# Patient Record
Sex: Male | Born: 1970 | Race: White | Hispanic: No | Marital: Married | Smoking: Former smoker
Health system: Southern US, Community
[De-identification: ages and names within clinical notes are randomized; demographics above are authoritative.]

## PROBLEM LIST (undated history)

## (undated) DIAGNOSIS — K259 Gastric ulcer, unspecified as acute or chronic, without hemorrhage or perforation: Secondary | ICD-10-CM

## (undated) DIAGNOSIS — I10 Essential (primary) hypertension: Secondary | ICD-10-CM

---

## 2004-03-01 ENCOUNTER — Other Ambulatory Visit: Payer: Self-pay

## 2007-07-19 ENCOUNTER — Encounter: Payer: Self-pay | Admitting: Internal Medicine

## 2010-12-27 ENCOUNTER — Emergency Department: Payer: Self-pay | Admitting: Internal Medicine

## 2012-03-30 ENCOUNTER — Ambulatory Visit (INDEPENDENT_AMBULATORY_CARE_PROVIDER_SITE_OTHER): Payer: 59 | Admitting: Emergency Medicine

## 2012-03-30 VITALS — BP 126/74 | HR 53 | Temp 98.0°F | Resp 17 | Ht 65.5 in | Wt 150.0 lb

## 2012-03-30 DIAGNOSIS — N644 Mastodynia: Secondary | ICD-10-CM

## 2012-03-30 DIAGNOSIS — R509 Fever, unspecified: Secondary | ICD-10-CM

## 2012-03-30 MED ORDER — DOXYCYCLINE HYCLATE 100 MG PO CAPS: 100.0000 mg | ORAL_CAPSULE | Freq: Two times a day (BID) | ORAL | Status: AC

## 2012-03-30 MED ORDER — RIZATRIPTAN BENZOATE 10 MG PO TBDP: 10.0000 mg | ORAL_TABLET | ORAL | Status: DC | PRN

## 2012-03-30 NOTE — Progress Notes (Signed)
9  Date:  03/30/2012   Name:  RYKKER COVIELLO   DOB:  01-26-1971   MRN:  [id number]  PCP:  No primary provider on file.    Chief Complaint: Edema   History of Present Illness:  Justin Davies is a 41 y.o. very pleasant male patient who presents with the following:  Headache, myalgias, arthralgias, rash and pain in left breast and both axillae.  [place]  There is no problem list on file for this patient.   No past medical history on file.  No past surgical history on file.  History  Substance Use Topics  . Smoking status: Never Smoker   . Smokeless tobacco: Not on file  . Alcohol Use: Not on file    No family history on file.  No Known Allergies  Medication list has been reviewed and updated.  No current outpatient prescriptions on file prior to visit.    Review of Systems:  As per HPI, otherwise negative.    Physical Examination: Filed Vitals:   03/30/12 1221  BP: 126/74  Pulse: 53  Temp: 98 F (36.7 C)  Resp: 17   Filed Vitals:   03/30/12 1221  Height: 5' 5.5" (1.664 m)  Weight: 150 lb (68.04 kg)   Body mass index is 24.58 kg/(m^2). Ideal Body Weight: Weight in (lb) to have BMI = 25: 152.2  GEN: WDWN, NAD, Non-toxic, A & O x 3 HEENT: Atraumatic, Normocephalic. Neck supple. No masses, No LAD. Ears and Nose: No external deformity. CV: RRR, No M/G/R. No JVD. No thrill. No extra heart sounds. PULM: CTA B, no wheezes, crackles, rhonchi. No retractions. No resp. distress. No accessory muscle use. ABD: S, NT, ND, +BS. No rebound. No HSM. EXTR: No c/c/e NEURO Normal gait.  PSYCH: Normally interactive. Conversant. Not depressed or anxious appearing.  Calm demeanor.  Breast:  Left breast tenderness no mass Axillae:  No palpable adenopathy but generalized tenderness Skin:  Rash no pruritic disseminated, 1-2 mm   Assessment and Plan: Breast pain:  Sonogram Rash:  Tick panel, doxycycline Migraine:  maxalt Follow up  1 week  Carmelina Dane, MD

## 2012-03-31 ENCOUNTER — Other Ambulatory Visit: Payer: Self-pay

## 2012-03-31 DIAGNOSIS — N644 Mastodynia: Secondary | ICD-10-CM

## 2012-04-01 ENCOUNTER — Encounter: Payer: Self-pay | Admitting: Emergency Medicine

## 2012-04-05 ENCOUNTER — Telehealth: Payer: Self-pay

## 2012-04-05 NOTE — Telephone Encounter (Signed)
I have left message to advise labs normal, letter was sent

## 2012-04-05 NOTE — Telephone Encounter (Signed)
Pt would like to know if his labs are in yet. Best# [phone]

## 2013-01-22 ENCOUNTER — Emergency Department: Payer: Self-pay | Admitting: Emergency Medicine

## 2014-11-12 ENCOUNTER — Emergency Department: Payer: Self-pay | Admitting: Emergency Medicine

## 2015-08-01 ENCOUNTER — Emergency Department
Admission: EM | Admit: 2015-08-01 | Discharge: 2015-08-01 | Disposition: A | Discharge status: Home / Self Care | Payer: Commercial Managed Care - HMO | Attending: Emergency Medicine | Admitting: Emergency Medicine

## 2015-08-01 ENCOUNTER — Emergency Department: Payer: Commercial Managed Care - HMO

## 2015-08-01 ENCOUNTER — Other Ambulatory Visit: Payer: Self-pay

## 2015-08-01 ENCOUNTER — Encounter: Payer: Self-pay | Admitting: *Deleted

## 2015-08-01 DIAGNOSIS — J398 Other specified diseases of upper respiratory tract: Secondary | ICD-10-CM | POA: Insufficient documentation

## 2015-08-01 DIAGNOSIS — R1010 Upper abdominal pain, unspecified: Secondary | ICD-10-CM | POA: Diagnosis present

## 2015-08-01 DIAGNOSIS — I10 Essential (primary) hypertension: Secondary | ICD-10-CM | POA: Diagnosis not present

## 2015-08-01 DIAGNOSIS — K2970 Gastritis, unspecified, without bleeding: Secondary | ICD-10-CM | POA: Insufficient documentation

## 2015-08-01 HISTORY — DX: Gastric ulcer, unspecified as acute or chronic, without hemorrhage or perforation: K25.9

## 2015-08-01 HISTORY — DX: Essential (primary) hypertension: I10

## 2015-08-01 LAB — COMPREHENSIVE METABOLIC PANEL
ALBUMIN: 4.1 g/dL (ref 3.5–5.0)
ALT: 19 U/L (ref 17–63)
ANION GAP: 9 (ref 5–15)
AST: 17 U/L (ref 15–41)
Alkaline Phosphatase: 66 U/L (ref 38–126)
BILIRUBIN TOTAL: 0.4 mg/dL (ref 0.3–1.2)
BUN: 14 mg/dL (ref 6–20)
CHLORIDE: 104 mmol/L (ref 101–111)
CO2: 25 mmol/L (ref 22–32)
Calcium: 9.4 mg/dL (ref 8.9–10.3)
Creatinine, Ser: 0.87 mg/dL (ref 0.61–1.24)
GFR calc Af Amer: >60 mL/min (ref >60)
GFR calc non Af Amer: >60 mL/min (ref >60)
GLUCOSE: 103 mg/dL — AB (ref 65–99)
POTASSIUM: 3.6 mmol/L (ref 3.5–5.1)
SODIUM: 138 mmol/L (ref 135–145)
TOTAL PROTEIN: 7.2 g/dL (ref 6.5–8.1)

## 2015-08-01 LAB — TROPONIN I

## 2015-08-01 LAB — CBC
HEMATOCRIT: 46.8 % (ref 40.0–52.0)
HEMOGLOBIN: 15.7 g/dL (ref 13.0–18.0)
MCH: 30.4 pg (ref 26.0–34.0)
MCHC: 33.5 g/dL (ref 32.0–36.0)
MCV: 91 fL (ref 80.0–100.0)
Platelets: 309 10*3/uL (ref 150–440)
RBC: 5.15 MIL/uL (ref 4.40–5.90)
RDW: 13 % (ref 11.5–14.5)
WBC: 12.5 10*3/uL — ABNORMAL HIGH (ref 3.8–10.6)

## 2015-08-01 LAB — LIPASE, BLOOD: Lipase: 17 U/L (ref 11–51)

## 2015-08-01 MED ORDER — SODIUM CHLORIDE 0.9 % IV BOLUS (SEPSIS)
1000.0000 mL | Freq: Once | INTRAVENOUS | Status: AC
  Administered 2015-08-01: 1000 mL via INTRAVENOUS

## 2015-08-01 MED ORDER — MORPHINE SULFATE (PF) 4 MG/ML IV SOLN
4.0000 mg | Freq: Once | INTRAVENOUS | Status: AC
  Administered 2015-08-01: 4 mg via INTRAVENOUS
  Filled 2015-08-01: qty 1

## 2015-08-01 MED ORDER — IOHEXOL 350 MG/ML SOLN
100.0000 mL | Freq: Once | INTRAVENOUS | Status: AC | PRN
  Administered 2015-08-01: 100 mL via INTRAVENOUS
  Filled 2015-08-01: qty 100

## 2015-08-01 MED ORDER — IOHEXOL 240 MG/ML SOLN
25.0000 mL | Freq: Once | INTRAMUSCULAR | Status: AC | PRN
  Administered 2015-08-01: 25 mL via ORAL
  Filled 2015-08-01: qty 25

## 2015-08-01 MED ORDER — ONDANSETRON 4 MG PO TBDP: 4.0000 mg | ORAL_TABLET | Freq: Four times a day (QID) | ORAL | Status: DC | PRN

## 2015-08-01 MED ORDER — ONDANSETRON HCL 4 MG/2ML IJ SOLN
4.0000 mg | Freq: Once | INTRAMUSCULAR | Status: AC
  Administered 2015-08-01: 4 mg via INTRAVENOUS

## 2015-08-01 MED ORDER — OXYCODONE-ACETAMINOPHEN 5-325 MG PO TABS
1.0000 | ORAL_TABLET | ORAL | Status: AC
  Administered 2015-08-01: 1 via ORAL

## 2015-08-01 MED ORDER — OXYCODONE-ACETAMINOPHEN 5-325 MG PO TABS: 1.0000 | ORAL_TABLET | Freq: Four times a day (QID) | ORAL | Status: DC | PRN

## 2015-08-01 MED ORDER — ONDANSETRON HCL 4 MG/2ML IJ SOLN
INTRAMUSCULAR | Status: AC
  Administered 2015-08-01: 4 mg via INTRAVENOUS
  Filled 2015-08-01: qty 2

## 2015-08-01 MED ORDER — ESOMEPRAZOLE MAGNESIUM 20 MG PO CPDR: 20.0000 mg | DELAYED_RELEASE_CAPSULE | Freq: Every day | ORAL | Status: DC

## 2015-08-01 MED ORDER — AZITHROMYCIN 250 MG PO TABS: ORAL_TABLET | ORAL | Status: DC

## 2015-08-01 MED ORDER — OXYCODONE-ACETAMINOPHEN 5-325 MG PO TABS
ORAL_TABLET | ORAL | Status: AC
  Administered 2015-08-01: 1 via ORAL
  Filled 2015-08-01: qty 1

## 2015-08-01 NOTE — ED Provider Notes (Signed)
[facility] Emergency Department Provider Note REMINDER - THIS NOTE IS NOT A FINAL MEDICAL RECORD UNTIL IT IS SIGNED. UNTIL THEN, THE CONTENT BELOW MAY REFLECT INFORMATION FROM A DOCUMENTATION TEMPLATE, NOT THE ACTUAL PATIENT VISIT. ____________________________________________  Time seen: Approximately 6:29 PM  I have reviewed the triage vital signs and the nursing notes.   HISTORY  Chief Complaint Abdominal Pain    HPI Justin Davies is a 44 y.o. male no significant previous medical history other than previous stomach ulcers.  Patient reports that he's been having some cough and congestion and a sinus infection over the last few days, started amoxicillin and stomach pain has been worsening since then. He reports he feels as though he has a "stomach virus" 3 days ago, and is now having increasing pain in the upper matter abdomen that radiates around to his back. Denies having a fever. No chills. No chest pain or any trouble breathing.  Able to eat and drink, but states that today having moderate nausea and fairly severe pain in the upper abdomen slowly worsening for the day.  He has not had any bloody vomit, he has had a couple loose stools the been nonblack and nonbloody.   Past Medical History  Diagnosis Date  . Multiple gastric ulcers   . Hypertension     There are no active problems to display for this patient.   History reviewed. No pertinent past surgical history.  Current Outpatient Rx  Name  Route  Sig  Dispense  Refill  . azithromycin (ZITHROMAX Z-PAK) 250 MG tablet      As directed on package   6 each   0   . esomeprazole (NEXIUM) 20 MG capsule   Oral   Take 1 capsule (20 mg total) by mouth daily.   30 capsule   1   . ondansetron (ZOFRAN ODT) 4 MG disintegrating tablet   Oral   Take 1 tablet (4 mg total) by mouth every 6 (six) hours as needed for nausea or vomiting.   20 tablet   0   . oxyCODONE-acetaminophen (ROXICET) 5-325  MG tablet   Oral   Take 1 tablet by mouth every 6 (six) hours as needed for severe pain.   20 tablet   0     Allergies Review of patient's allergies indicates no known allergies.  No family history on file.  Social History Social History  Substance Use Topics  . Smoking status: Never Smoker   . Smokeless tobacco: None  . Alcohol Use: No    Review of Systems Constitutional: No fever/chills Eyes: No visual changes. ENT:  Slightly scratchy throat, also sinus pressure getting somewhat better after starting amoxicillin Cardiovascular: Denies chest pain. Respiratory: Denies shortness of breath. Gastrointestinal:  Couple loose stools today. No vomiting, nausea and pain are notable. Genitourinary: Negative for dysuria. Musculoskeletal: Negative for back pain. Skin: Negative for rash. Neurological: Negative for headaches, focal weakness or numbness.  10-point ROS otherwise negative.  ____________________________________________   PHYSICAL EXAM:  VITAL SIGNS: ED Triage Vitals  Enc Vitals Group     BP 08/01/15 1637 152/117 mmHg     Pulse Rate 08/01/15 1637 82     Resp 08/01/15 1637 24     Temp 08/01/15 1637 98.3 F (36.8 C)     Temp Source 08/01/15 1637 Oral     SpO2 08/01/15 1637 98 %     Weight 08/01/15 1637 147 lb (66.679 kg)     Height 08/01/15 1637 5'  6" (1.676 m)     Head Cir --      Peak Flow --      Pain Score 08/01/15 1638 7     Pain Loc --      Pain Edu? --      Excl. in GC? --    Constitutional: Alert and oriented. Well appearing and in no acute distress. Eyes: Conjunctivae are normal. PERRL. EOMI. Head: Atraumatic. Nose: No congestion/rhinnorhea. Mouth/Throat: Mucous membranes are moist.  Oropharynx non-erythematous. and mild tonsillar hypertrophy bilaterally, no exudates. Neck: No stridor.   Cardiovascular: Normal rate, regular rhythm. Grossly normal heart sounds.  Good peripheral circulation. Respiratory: Normal respiratory effort.  No retractions.  Lungs CTAB. Gastrointestinal: Soft and nontender no lower abdomen and right lower quadrant, however he has moderate tenderness in the epigastrium without rebound or guarding . No distention. No abdominal bruits. No CVA tenderness. Musculoskeletal: No lower extremity tenderness nor edema.  No joint effusions. Neurologic:  Normal speech and language. No gross focal neurologic deficits are appreciated. No gait instability. Skin:  Skin is warm, dry and intact. No rash noted. Psychiatric: Mood and affect are normal. Speech and behavior are normal.  ____________________________________________   LABS (all labs ordered are listed, but only abnormal results are displayed)  Labs Reviewed  COMPREHENSIVE METABOLIC PANEL - Abnormal; Notable for the following:    Glucose, Bld 103 (*)    All other components within normal limits  CBC - Abnormal; Notable for the following:    WBC 12.5 (*)    All other components within normal limits  LIPASE, BLOOD  TROPONIN I   ____________________________________________  EKG  ED ECG REPORT I, Kathia Covington, the attending physician, personally viewed and interpreted this ECG.  Date: 08/01/2015 EKG Time: 1700 Rate: 75 Rhythm: normal sinus rhythm QRS Axis: normal Intervals: normal ST/T Wave abnormalities: normal Conduction Disutrbances: none Narrative Interpretation: unremarkable, computer notes previous septal infarct. I would disagree with this reading, likely secondary to lead positioning and I see no signs of acute ischemia.  ____________________________________________  RADIOLOGY  CT Abdomen Pelvis W Contrast (Final result) Result time: 08/01/15 18:32:59   Final result by Rad Results In Interface (08/01/15 18:32:59)   Narrative:   CLINICAL DATA: 44 year old male with pain across the upper abdomen which radiates around the back. Nausea.  EXAM: CT ABDOMEN AND PELVIS WITH CONTRAST  TECHNIQUE: Multidetector CT imaging of the abdomen and pelvis  was performed using the standard protocol following bolus administration of intravenous contrast.  CONTRAST: OMNIPAQUE IOHEXOL 350 MG/ML SOLN  COMPARISON: No priors.  FINDINGS: Lower chest: Scarring in the right middle lobe.  Hepatobiliary: No significant cystic or solid hepatic lesions. No intra or extrahepatic biliary ductal dilatation. Gallbladder is normal in appearance.  Pancreas: No pancreatic mass. No pancreatic ductal dilatation. No pancreatic or peripancreatic fluid or inflammatory changes.  Spleen: No priors.  Adrenals/Urinary Tract: Bilateral kidneys and bilateral adrenal glands are normal in appearance. No hydroureteronephrosis. Urinary bladder is normal in appearance.  Stomach/Bowel: Normal appearance of the stomach. No pathologic dilatation of small bowel or colon. Normal appendix.  Vascular/Lymphatic: Atherosclerosis throughout the abdominal and pelvic vasculature, without evidence of aneurysm. No lymphadenopathy noted in the abdomen or pelvis.  Reproductive: Prostate gland and seminal vesicles are unremarkable in appearance.  Other: No significant volume of ascites. No pneumoperitoneum.  Musculoskeletal: There are no aggressive appearing lytic or blastic lesions noted in the visualized portions of the skeleton.  IMPRESSION: 1. No acute findings in the abdomen or pelvis to  account for the patient's symptoms. 2. Normal appendix. 3. Mild atherosclerosis.     ____________________________________________   PROCEDURES  Procedure(s) performed: None  Critical Care performed: No  ____________________________________________   INITIAL IMPRESSION / ASSESSMENT AND PLAN / ED COURSE  Pertinent labs & imaging results that were available during my care of the patient were reviewed by me and considered in my medical decision making (see chart for details).  This presents for increasing nausea with severe upper abdominal pain worsening  throughout the day. He is currently on amoxicillin, and has what appears to be likely mild sinusitis or upper respiratory infection. No signs or symptoms of instability, abdominal exam does demonstrate focal epigastric tenderness but no surgical signs or symptoms. He does not report any bloody emesis, no blood in his stool. His hemoglobin is normal. Again severe bleeding nor major ulcerative bleed. There is no evidence of perforation on CT. CT scan is reassuring, EKG no ischemic changes and negative troponin without significant cardiac risk factors.  Doesn't mild leukocytosis which is probably accounted for by his sinusitis/upper respiratory infection.  ----------------------------------------- 6:55 PM on 08/01/2015 -----------------------------------------  Patient reports pain nausea and symptoms are improved, still some upper abdominal pain but no rebound, guarding or notable peritoneal signs. He is awake alert in no distress and ambulatory. Nothing to suggest acute intra-abdominal process or acute cardiopulmonary process this time. Denies current on her symptoms. We'll place the patient on Nexium, advise close follow-up with his primary care doctor and GI. Careful abdominal pain return precautions advised, patient very agreeable. Much improved. He agrees not to drive tonight.  I will prescribe the patient a narcotic pain medicine due to their condition which I anticipate will cause at least moderate pain short term. I discussed with the patient safe use of narcotic pain medicines, and that they are not to drive, work in dangerous areas, or ever take more than prescribed (no more than 1 pill every 6 hours). We discussed that this is the type of medication that "Criss Alvinerince" may have overdosed on and the risks of this type of medicine. Patient is very agreeable to only use as prescribed and to never use more than prescribed.  He is agreeable to follow closely with gastroenterology. We discussed very careful  return precautions and he is very agreeable and verbalized understanding.  ____________________________________________   FINAL CLINICAL IMPRESSION(S) / ED DIAGNOSES  Final diagnoses:  Upper abdominal pain  Gastritis   possible stomach ulcer    Sharyn CreamerMark Hermela Hardt, MD 08/01/15 1906

## 2015-08-01 NOTE — Discharge Instructions (Signed)
You were seen in the emergency room for abdominal pain. It is important that you follow up closely with your primary care doctor in the next couple of days. ° °If you're unable to see her primary care doctor you may return to the emergency room or go to the [facility] walk-in clinic in 1 or 2 days for reexam. ° °Please return to the emergency room right away if you are to develop a fever, severe nausea, your pain becomes severe or worsens, you are unable to keep food down, begin vomiting any dark or bloody fluid, you develop any dark or bloody stools, feel dehydrated, or other new concerns or symptoms arise. ° ° °Abdominal Pain, Adult °Many things can cause abdominal pain. Usually, abdominal pain is not caused by a disease and will improve without treatment. It can often be observed and treated at home. Your health care provider will do a physical exam and possibly order blood tests and X-rays to help determine the seriousness of your pain. However, in many cases, more time must pass before a clear cause of the pain can be found. Before that point, your health care provider may not know if you need more testing or further treatment. °HOME CARE INSTRUCTIONS °Monitor your abdominal pain for any changes. The following actions may help to alleviate any discomfort you are experiencing: °· Only take over-the-counter or prescription medicines as directed by your health care provider. °· Do not take laxatives unless directed to do so by your health care provider. °· Try a clear liquid diet (broth, tea, or water) as directed by your health care provider. Slowly move to a bland diet as tolerated. °SEEK MEDICAL CARE IF: °· You have unexplained abdominal pain. °· You have abdominal pain associated with nausea or diarrhea. °· You have pain when you urinate or have a bowel movement. °· You experience abdominal pain that wakes you in the night. °· You have abdominal pain that is worsened or improved by eating food. °· You have  abdominal pain that is worsened with eating fatty foods. °· You have a fever. °SEEK IMMEDIATE MEDICAL CARE IF: °· Your pain does not go away within 2 hours. °· You keep throwing up (vomiting). °· Your pain is felt only in portions of the abdomen, such as the right side or the left lower portion of the abdomen. °· You pass bloody or black tarry stools. °MAKE SURE YOU: °· Understand these instructions. °· Will watch your condition. °· Will get help right away if you are not doing well or get worse. °  °This information is not intended to replace advice given to you by your health care provider. Make sure you discuss any questions you have with your health care provider. °  °Document Released: 05/27/2005 Document Revised: 05/08/2015 Document Reviewed: 04/26/2013 °Elsevier Interactive Patient Education ©2016 [employer] ° °

## 2015-08-01 NOTE — ED Notes (Signed)
Pt states upper abd pain, states he feels like he has "ripped something or been shot", pt states he had a stomach virus 3 days ago and was also taking amoxicillin for a sinus infection, pt awake and alert, ambulatory to room

## 2015-08-01 NOTE — ED Notes (Signed)
Pain across upper abd and radiates around to back, prior had stomach virus, yesterday was ok, today developed abd pain, has some nausea

## 2017-11-29 ENCOUNTER — Other Ambulatory Visit: Payer: Self-pay | Admitting: Internal Medicine

## 2017-11-29 DIAGNOSIS — N61 Mastitis without abscess: Secondary | ICD-10-CM

## 2017-11-30 ENCOUNTER — Other Ambulatory Visit: Payer: Self-pay | Admitting: Internal Medicine

## 2017-11-30 DIAGNOSIS — N61 Mastitis without abscess: Secondary | ICD-10-CM

## 2017-12-03 ENCOUNTER — Ambulatory Visit
Admission: RE | Admit: 2017-12-03 | Discharge: 2017-12-03 | Disposition: A | Discharge status: Home / Self Care | Payer: 59 | Source: Ambulatory Visit | Attending: Internal Medicine | Admitting: Internal Medicine

## 2017-12-03 DIAGNOSIS — N61 Mastitis without abscess: Secondary | ICD-10-CM

## 2018-03-17 ENCOUNTER — Other Ambulatory Visit: Payer: Self-pay | Admitting: Internal Medicine

## 2018-03-17 DIAGNOSIS — M12811 Other specific arthropathies, not elsewhere classified, right shoulder: Secondary | ICD-10-CM

## 2018-06-29 ENCOUNTER — Other Ambulatory Visit: Payer: Self-pay | Admitting: Internal Medicine

## 2018-06-29 DIAGNOSIS — M12811 Other specific arthropathies, not elsewhere classified, right shoulder: Secondary | ICD-10-CM

## 2019-02-24 ENCOUNTER — Other Ambulatory Visit: Payer: Self-pay | Admitting: Internal Medicine

## 2019-02-24 DIAGNOSIS — N61 Mastitis without abscess: Secondary | ICD-10-CM

## 2019-03-02 ENCOUNTER — Observation Stay
Admission: EM | Admit: 2019-03-02 | Discharge: 2019-03-03 | Disposition: A | Discharge status: Home / Self Care | Payer: 59 | Attending: Internal Medicine | Admitting: Internal Medicine

## 2019-03-02 ENCOUNTER — Emergency Department: Payer: 59

## 2019-03-02 ENCOUNTER — Encounter: Payer: Self-pay | Admitting: Emergency Medicine

## 2019-03-02 ENCOUNTER — Other Ambulatory Visit: Payer: Self-pay

## 2019-03-02 DIAGNOSIS — Z1159 Encounter for screening for other viral diseases: Secondary | ICD-10-CM | POA: Insufficient documentation

## 2019-03-02 DIAGNOSIS — I10 Essential (primary) hypertension: Secondary | ICD-10-CM | POA: Insufficient documentation

## 2019-03-02 DIAGNOSIS — I160 Hypertensive urgency: Secondary | ICD-10-CM | POA: Insufficient documentation

## 2019-03-02 DIAGNOSIS — R0602 Shortness of breath: Secondary | ICD-10-CM | POA: Diagnosis present

## 2019-03-02 DIAGNOSIS — Z87891 Personal history of nicotine dependence: Secondary | ICD-10-CM | POA: Diagnosis not present

## 2019-03-02 DIAGNOSIS — Z79899 Other long term (current) drug therapy: Secondary | ICD-10-CM | POA: Diagnosis not present

## 2019-03-02 DIAGNOSIS — E876 Hypokalemia: Secondary | ICD-10-CM | POA: Diagnosis present

## 2019-03-02 DIAGNOSIS — K279 Peptic ulcer, site unspecified, unspecified as acute or chronic, without hemorrhage or perforation: Secondary | ICD-10-CM | POA: Insufficient documentation

## 2019-03-02 DIAGNOSIS — G43909 Migraine, unspecified, not intractable, without status migrainosus: Secondary | ICD-10-CM | POA: Diagnosis not present

## 2019-03-02 DIAGNOSIS — I491 Atrial premature depolarization: Secondary | ICD-10-CM | POA: Insufficient documentation

## 2019-03-02 DIAGNOSIS — R079 Chest pain, unspecified: Secondary | ICD-10-CM

## 2019-03-02 DIAGNOSIS — I200 Unstable angina: Principal | ICD-10-CM | POA: Insufficient documentation

## 2019-03-02 LAB — CBC
HCT: 49.4 % (ref 39.0–52.0)
Hemoglobin: 16.6 g/dL (ref 13.0–17.0)
MCH: 29.5 pg (ref 26.0–34.0)
MCHC: 33.6 g/dL (ref 30.0–36.0)
MCV: 87.9 fL (ref 80.0–100.0)
Platelets: 366 10*3/uL (ref 150–400)
RBC: 5.62 MIL/uL (ref 4.22–5.81)
RDW: 12.6 % (ref 11.5–15.5)
WBC: 13.1 10*3/uL — ABNORMAL HIGH (ref 4.0–10.5)
nRBC: 0 % (ref 0.0–0.2)

## 2019-03-02 LAB — BASIC METABOLIC PANEL
Anion gap: 10 (ref 5–15)
BUN: 11 mg/dL (ref 6–20)
CO2: 27 mmol/L (ref 22–32)
Calcium: 8.9 mg/dL (ref 8.9–10.3)
Chloride: 98 mmol/L (ref 98–111)
Creatinine, Ser: 0.79 mg/dL (ref 0.61–1.24)
GFR calc Af Amer: >60 mL/min (ref >60)
GFR calc non Af Amer: >60 mL/min (ref >60)
Glucose, Bld: 110 mg/dL — ABNORMAL HIGH (ref 70–99)
Potassium: 3.1 mmol/L — ABNORMAL LOW (ref 3.5–5.1)
Sodium: 135 mmol/L (ref 135–145)

## 2019-03-02 LAB — TROPONIN I (HIGH SENSITIVITY)
Troponin I (High Sensitivity): 7 ng/L (ref <18)
Troponin I (High Sensitivity): 4 ng/L (ref <18)

## 2019-03-02 MED ORDER — SODIUM CHLORIDE 0.9% FLUSH: 3.0000 mL | Freq: Once | INTRAVENOUS | Status: DC

## 2019-03-02 MED ORDER — ASPIRIN 81 MG PO CHEW
324.0000 mg | CHEWABLE_TABLET | Freq: Once | ORAL | Status: AC
  Administered 2019-03-02: 324 mg via ORAL
  Filled 2019-03-02: qty 4

## 2019-03-02 MED ORDER — POTASSIUM CHLORIDE CRYS ER 20 MEQ PO TBCR
40.0000 meq | EXTENDED_RELEASE_TABLET | Freq: Once | ORAL | Status: AC
  Administered 2019-03-02: 40 meq via ORAL
  Filled 2019-03-02: qty 2

## 2019-03-02 MED ORDER — NITROGLYCERIN 2 % TD OINT
1.0000 [in_us] | TOPICAL_OINTMENT | Freq: Once | TRANSDERMAL | Status: AC
  Administered 2019-03-02: 1 [in_us] via TOPICAL
  Filled 2019-03-02: qty 1

## 2019-03-02 NOTE — ED Notes (Signed)
This RN walked into pt's room and pt is sitting on side of the bed pointing to right side chest,pt st "my chest hurts". Pt c/o R/sided CP 8/10 "shooting" pain down right arm. Pt st feeling nauseous, lightheaded and SHOB. Pt denies taking medication.

## 2019-03-02 NOTE — ED Provider Notes (Signed)
[facility] Emergency Department Provider Note   ____________________________________________   First MD Initiated Contact with Patient 03/02/19 2311     (approximate)  I have reviewed the triage vital signs and the nursing notes.   HISTORY  Chief Complaint Chest Pain    HPI Justin Davies is a 48 y.o. male who presents to the ED from home with a chief complaint of chest pain.  Patient complains of waxing/waning central chest aching x4 days.  Presents to the ED tonight because he was sweating and dizzy.  Symptoms associated with nausea without vomiting.  Pain was worse tonight and associated with "indigestion".  Denies fever, cough, abdominal pain, dysuria, diarrhea.  Denies recent travel, trauma or exposure to persons diagnosed with coronavirus.  His PCP 8 days ago and started on antihypertensive.       Past Medical History:  Diagnosis Date  . Hypertension   . Multiple gastric ulcers     There are no active problems to display for this patient.   History reviewed. No pertinent surgical history.  Prior to Admission medications   Medication Sig Start Date End Date Taking? Authorizing Provider  azithromycin (ZITHROMAX Z-PAK) 250 MG tablet As directed on package 08/01/15   Sharyn CreamerQuale, Mark, MD  esomeprazole (NEXIUM) 20 MG capsule Take 1 capsule (20 mg total) by mouth daily. 08/01/15   Sharyn CreamerQuale, Mark, MD  ondansetron (ZOFRAN ODT) 4 MG disintegrating tablet Take 1 tablet (4 mg total) by mouth every 6 (six) hours as needed for nausea or vomiting. 08/01/15   Sharyn CreamerQuale, Mark, MD  oxyCODONE-acetaminophen (ROXICET) 5-325 MG tablet Take 1 tablet by mouth every 6 (six) hours as needed for severe pain. 08/01/15   Sharyn CreamerQuale, Mark, MD    Allergies Patient has no known allergies.  No family history on file.  Social History Social History   Tobacco Use  . Smoking status: Former Smoker    Types: Cigarettes  . Smokeless tobacco: Never Used  Substance Use Topics  . Alcohol  use: No  . Drug use: Never    Review of Systems  Constitutional: No fever/chills Eyes: No visual changes. ENT: No sore throat. Cardiovascular: Postivie for chest pain. Respiratory: Denies shortness of breath. Gastrointestinal: No abdominal pain.  Positive for nausea, no vomiting.  No diarrhea.  No constipation. Genitourinary: Negative for dysuria. Musculoskeletal: Negative for back pain. Skin: Negative for rash. Neurological: Negative for headaches, focal weakness or numbness.   ____________________________________________   PHYSICAL EXAM:  VITAL SIGNS: ED Triage Vitals  Enc Vitals Group     BP 03/02/19 1921 (!) 138/108     Pulse Rate 03/02/19 1921 88     Resp 03/02/19 1921 18     Temp 03/02/19 1921 98.8 F (37.1 C)     Temp Source 03/02/19 1921 Oral     SpO2 03/02/19 1921 97 %     Weight 03/02/19 1928 165 lb (74.8 kg)     Height 03/02/19 1928 5\' 6"  (1.676 m)     Head Circumference --      Peak Flow --      Pain Score 03/02/19 1927 7     Pain Loc --      Pain Edu? --      Excl. in GC? --     Constitutional: Alert and oriented. Well appearing and in mild acute distress. Eyes: Conjunctivae are normal. PERRL. EOMI. Head: Atraumatic. Nose: No congestion/rhinnorhea. Mouth/Throat: Mucous membranes are moist.  Oropharynx non-erythematous. Neck: No stridor.   Cardiovascular:  Normal rate, regular rhythm. Grossly normal heart sounds.  Good peripheral circulation. Respiratory: Normal respiratory effort.  No retractions. Lungs CTAB. Gastrointestinal: Soft and nontender to light or deep palpation. No distention. No abdominal bruits. No CVA tenderness. Musculoskeletal: No lower extremity tenderness nor edema.  No joint effusions. Neurologic:  Normal speech and language. No gross focal neurologic deficits are appreciated. No gait instability. Skin:  Skin is warm, dry and intact. No rash noted. Psychiatric: Mood and affect are normal. Speech and behavior are normal.   ____________________________________________   LABS (all labs ordered are listed, but only abnormal results are displayed)  Labs Reviewed  BASIC METABOLIC PANEL - Abnormal; Notable for the following components:      Result Value   Potassium 3.1 (*)    Glucose, Bld 110 (*)    All other components within normal limits  CBC - Abnormal; Notable for the following components:   WBC 13.1 (*)    All other components within normal limits  SARS CORONAVIRUS 2 (HOSPITAL ORDER, [facility])  TROPONIN I (HIGH SENSITIVITY)  TROPONIN I (HIGH SENSITIVITY)   ____________________________________________  EKG  ED ECG REPORT I, SUNG,JADE J, the attending physician, personally viewed and interpreted this ECG.   Date: 03/02/2019  EKG Time: 1919  Rate: 83  Rhythm: normal EKG, normal sinus rhythm  Axis: Normal  Intervals:none  ST&T Change: Nonspecific  ____________________________________________  RADIOLOGY  ED MD interpretation: No acute cardiopulmonary process  Official radiology report(s): Dg Chest Port 1 View  Result Date: 03/02/2019 CLINICAL DATA:  Mid sternal pain EXAM: PORTABLE CHEST 1 VIEW COMPARISON:  12/27/2010 FINDINGS: Heart and mediastinal contours are within normal limits. No focal opacities or effusions. No acute bony abnormality. IMPRESSION: No active disease. Electronically Signed   By: Rolm Baptise M.D.   On: 03/02/2019 23:08    ____________________________________________   PROCEDURES  Procedure(s) performed (including Critical Care):  Procedures   ____________________________________________   INITIAL IMPRESSION / ASSESSMENT AND PLAN / ED COURSE  As part of my medical decision making, I reviewed the following data within the [facility] notes reviewed and incorporated, Labs reviewed, EKG interpreted, Old chart reviewed, Radiograph reviewed, Discussed with admitting physician and Notes from prior ED visits      Justin Davies was evaluated in Emergency Department on 03/02/2019 for the symptoms described in the history of present illness. He was evaluated in the context of the global COVID-19 pandemic, which necessitated consideration that the patient might be at risk for infection with the SARS-CoV-2 virus that causes COVID-19. Institutional protocols and algorithms that pertain to the evaluation of patients at risk for COVID-19 are in a state of rapid change based on information released by regulatory bodies including the CDC and federal and state organizations. These policies and algorithms were followed during the patient's care in the ED.   48 year old male with hypertension who presents with chest pain concerning for unstable angina. Differential diagnosis includes, but is not limited to, ACS, aortic dissection, pulmonary embolism, cardiac tamponade, pneumothorax, pneumonia, pericarditis, myocarditis, GI-related causes including esophagitis/gastritis, and musculoskeletal chest wall pain.    Despite marginally normal troponins, patient is currently rating pain 6/10.  Symptoms are concerning for unstable angina.  Will administer aspirin, nitroglycerin paste, oral potassium.  Discuss with hospitalist to evaluate patient in the emergency department for admission.     ____________________________________________   FINAL CLINICAL IMPRESSION(S) / ED DIAGNOSES  Final diagnoses:  Unstable angina (HCC)  Chest pain, unspecified type  Essential hypertension  Hypokalemia     ED Discharge Orders    None       Note:  This document was prepared using Dragon voice recognition software and may include unintentional dictation errors.   Irean HongSung, Jade J, MD 03/03/19 [phone]

## 2019-03-02 NOTE — ED Triage Notes (Addendum)
Pt presents to ED with mid sternal squeezing chest pain since Sunday night. Pt thought it was reflux at first. Pt states yesterday he became nauseated and has been diaphoretic with worsening pain. Pt states nothing seems to make his symptoms better or worse. No hx of the same. +sob. Pt currently has no obvious increased work of breathing or acute distress noted.

## 2019-03-03 DIAGNOSIS — R079 Chest pain, unspecified: Secondary | ICD-10-CM | POA: Diagnosis present

## 2019-03-03 LAB — TROPONIN I (HIGH SENSITIVITY): Troponin I (High Sensitivity): 4 ng/L (ref <18)

## 2019-03-03 LAB — SARS CORONAVIRUS 2 BY RT PCR (HOSPITAL ORDER, PERFORMED IN ~~LOC~~ HOSPITAL LAB): SARS Coronavirus 2: NEGATIVE

## 2019-03-03 LAB — MAGNESIUM: Magnesium: 2.3 mg/dL (ref 1.7–2.4)

## 2019-03-03 MED ORDER — HYDROCHLOROTHIAZIDE 12.5 MG PO CAPS
12.5000 mg | ORAL_CAPSULE | Freq: Every day | ORAL | Status: DC
  Administered 2019-03-03: 12.5 mg via ORAL
  Filled 2019-03-03: qty 1

## 2019-03-03 MED ORDER — ONDANSETRON HCL 4 MG/2ML IJ SOLN
4.0000 mg | Freq: Four times a day (QID) | INTRAMUSCULAR | Status: DC | PRN
  Administered 2019-03-03: 06:00:00 4 mg via INTRAVENOUS
  Filled 2019-03-03: qty 2

## 2019-03-03 MED ORDER — ZOLPIDEM TARTRATE 5 MG PO TABS: 5.0000 mg | ORAL_TABLET | Freq: Every evening | ORAL | Status: DC | PRN

## 2019-03-03 MED ORDER — POTASSIUM CHLORIDE 20 MEQ PO PACK
40.0000 meq | PACK | Freq: Once | ORAL | Status: AC
  Administered 2019-03-03: 40 meq via ORAL
  Filled 2019-03-03: qty 2

## 2019-03-03 MED ORDER — ACETAMINOPHEN 325 MG PO TABS
650.0000 mg | ORAL_TABLET | ORAL | Status: DC | PRN
  Administered 2019-03-03: 650 mg via ORAL
  Filled 2019-03-03: qty 2

## 2019-03-03 MED ORDER — OXYCODONE-ACETAMINOPHEN 5-325 MG PO TABS
1.0000 | ORAL_TABLET | ORAL | Status: DC | PRN
  Administered 2019-03-03: 1 via ORAL
  Filled 2019-03-03: qty 1

## 2019-03-03 MED ORDER — MORPHINE SULFATE (PF) 2 MG/ML IV SOLN: 2.0000 mg | INTRAVENOUS | Status: DC | PRN

## 2019-03-03 MED ORDER — ASPIRIN EC 81 MG PO TBEC
81.0000 mg | DELAYED_RELEASE_TABLET | Freq: Every day | ORAL | Status: DC
  Administered 2019-03-03: 09:00:00 81 mg via ORAL
  Filled 2019-03-03: qty 1

## 2019-03-03 MED ORDER — LIDOCAINE VISCOUS HCL 2 % MT SOLN
15.0000 mL | Freq: Once | OROMUCOSAL | Status: AC
  Administered 2019-03-03: 15 mL via ORAL
  Filled 2019-03-03 (×2): qty 15

## 2019-03-03 MED ORDER — LABETALOL HCL 5 MG/ML IV SOLN: 20.0000 mg | INTRAVENOUS | Status: DC | PRN

## 2019-03-03 MED ORDER — NITROGLYCERIN 0.4 MG SL SUBL: 0.4000 mg | SUBLINGUAL_TABLET | SUBLINGUAL | Status: DC | PRN

## 2019-03-03 MED ORDER — ALUM & MAG HYDROXIDE-SIMETH 200-200-20 MG/5ML PO SUSP
30.0000 mL | Freq: Once | ORAL | Status: AC
  Administered 2019-03-03: 30 mL via ORAL
  Filled 2019-03-03: qty 30

## 2019-03-03 MED ORDER — ENOXAPARIN SODIUM 40 MG/0.4ML ~~LOC~~ SOLN
40.0000 mg | SUBCUTANEOUS | Status: DC
  Filled 2019-03-03: qty 0.4

## 2019-03-03 MED ORDER — SODIUM CHLORIDE 0.9 % IV SOLN
INTRAVENOUS | Status: DC
  Administered 2019-03-03: 04:00:00 via INTRAVENOUS

## 2019-03-03 MED ORDER — LISINOPRIL-HYDROCHLOROTHIAZIDE 20-12.5 MG PO TABS: 1.0000 | ORAL_TABLET | Freq: Every day | ORAL | Status: DC

## 2019-03-03 MED ORDER — LISINOPRIL 20 MG PO TABS
20.0000 mg | ORAL_TABLET | Freq: Every day | ORAL | Status: DC
  Administered 2019-03-03: 20 mg via ORAL
  Filled 2019-03-03: qty 1

## 2019-03-03 MED ORDER — MORPHINE SULFATE (PF) 4 MG/ML IV SOLN
4.0000 mg | Freq: Once | INTRAVENOUS | Status: AC
  Administered 2019-03-03: 01:00:00 4 mg via INTRAVENOUS
  Filled 2019-03-03: qty 1

## 2019-03-03 MED ORDER — ONDANSETRON HCL 4 MG/2ML IJ SOLN
4.0000 mg | Freq: Once | INTRAMUSCULAR | Status: AC
  Administered 2019-03-03: 01:00:00 4 mg via INTRAVENOUS
  Filled 2019-03-03: qty 2

## 2019-03-03 MED ORDER — POTASSIUM CHLORIDE 20 MEQ PO PACK
40.0000 meq | PACK | Freq: Once | ORAL | Status: AC
  Administered 2019-03-03: 06:00:00 40 meq via ORAL
  Filled 2019-03-03: qty 2

## 2019-03-03 MED ORDER — ASPIRIN 81 MG PO TBEC: 81.0000 mg | DELAYED_RELEASE_TABLET | Freq: Every day | ORAL | 0 refills | Status: AC

## 2019-03-03 NOTE — Progress Notes (Signed)
Discharge instructions explained to pt/ verbalized an understanding / iv and tele removed/ transported off unit via wheelchair.  

## 2019-03-03 NOTE — Discharge Summary (Signed)
[facility] NAME: Justin Davies    MR#:  [id number]  DATE OF BIRTH:  Jul 03, 1971  DATE OF ADMISSION:  03/02/2019 ADMITTING PHYSICIAN: Christel Mormon, MD  DATE OF DISCHARGE: 03/03/2019  PRIMARY CARE PHYSICIAN: Audley Hose, MD   ADMISSION DIAGNOSIS:  Unstable angina ([place]) [I20.0] Hypokalemia [E87.6] SOB (shortness of breath) [R06.02] Essential hypertension [I10] Chest pain [R07.9] Chest pain, unspecified type [R07.9]  DISCHARGE DIAGNOSIS:  Atypical chest pain Hypokalemia Hypertension Musculoskeletal chest pain SECONDARY DIAGNOSIS:   Past Medical History:  Diagnosis Date  . Hypertension   . Multiple gastric ulcers      ADMITTING HISTORY Justin Davies  is a 48 y.o. Caucasian male with a known history of hypertension and peptic ulcer disease, who presented to the emergency room with acute onset of midsternal chest pain felt like tightness and graded 7/10 in severity with occasional radiation to the right shoulder as well as diaphoresis mild dyspnea and generalized weakness with clammy feeling and dizziness.  He denied any cough or wheezing or fever or chills or any recent sick exposures.  No leg pain or edema or recent travels or surgeries.  No bleeding diathesis.  No headache or blurred vision. Upon presentation to the emergency room, blood pressure was elevated 138/108 with otherwise normal vital signs.  Labs revealed high-sensitivity troponin of 7 and later on 4.  CBC showed leukocytosis 13.1.  He was hypokalemic with potassium of 3.1.  SARS- COV-2 rapid test came back negative.  EKG showed normal sinus rhythm with rate of 83 with PACs and slightly poor R wave progression.  Portable chest x-ray revealed no acute cardia pulmonary disease. The patient was given 40 aspirin, 4 mg of IV morphine sulfate and 1 inch of Nitropaste.  I ordered 40 mill: P.o. potassium chloride.  He will be admitted to an observation telemetry bed for  further evaluation and management.  HOSPITAL COURSE:  Patient admitted to telemetry.  COVID-19 test was negative.  Potassium was supplemented during hospitalization.  Patient continued with blood pressure medications.  Patient was seen by cardiology and did not recommend any acute intervention.  The chest pain is atypical in nature.  Musculoskeletal in etiology.  Patient has appointment for breast swelling and tenderness on the right side with outpatient surgical service.  Patient hemodynamically stable will be discharged home.  CONSULTS OBTAINED:  Treatment Team:  Teodoro Spray, MD  DRUG ALLERGIES:   Allergies  Allergen Reactions  . Amoxicillin Other (See Comments)    Foamed in throat  . Bee Venom Swelling    DISCHARGE MEDICATIONS:   Allergies as of 03/03/2019      Reactions   Amoxicillin Other (See Comments)   Foamed in throat   Bee Venom Swelling      Medication List    TAKE these medications   aspirin 81 MG EC tablet Take 1 tablet (81 mg total) by mouth daily. Start taking on: March 04, 2019   lisinopril-hydrochlorothiazide 20-12.5 MG tablet Commonly known as: ZESTORETIC TAKE 1 TABLET BY MOUTH EVERY DAY IN THE MORNING       Today  Patient seen today No chest pain Feels comfortable Hemodynamically VITAL SIGNS:  Blood pressure 111/78, pulse 60, temperature 98 F (36.7 C), temperature source Oral, resp. rate 19, height 5\' 6"  (1.676 m), weight 77.6 kg, SpO2 98 %.  I/O:    Intake/Output Summary (Last 24 hours) at 03/03/2019 1050 Last data filed at 03/03/2019 0634 Gross per  24 hour  Intake 151.63 ml  Output -  Net 151.63 ml    PHYSICAL EXAMINATION:  Physical Exam  GENERAL:  48 y.o.-year-old patient lying in the bed with no acute distress.  LUNGS: Normal breath sounds bilaterally, no wheezing, rales,rhonchi or crepitation. No use of accessory muscles of respiration.  CARDIOVASCULAR: S1, S2 normal. No murmurs, rubs, or gallops.  ABDOMEN: Soft, non-tender,  non-distended. Bowel sounds present. No organomegaly or mass.  NEUROLOGIC: Moves all 4 extremities. PSYCHIATRIC: The patient is alert and oriented x 3.  SKIN: No obvious rash, lesion, or ulcer.   DATA REVIEW:   CBC Recent Labs  Lab 03/02/19 2039  WBC 13.1*  HGB 16.6  HCT 49.4  PLT 366    Chemistries  Recent Labs  Lab 03/02/19 2039 03/02/19 2300  NA 135  --   K 3.1*  --   CL 98  --   CO2 27  --   GLUCOSE 110*  --   BUN 11  --   CREATININE 0.79  --   CALCIUM 8.9  --   MG  --  2.3    Cardiac Enzymes No results for input(s): TROPONINI in the last 168 hours.  Microbiology Results  Results for orders placed or performed during the hospital encounter of 03/02/19  SARS Coronavirus 2 (CEPHEID - Performed in [facility] hospital lab), Hosp Order     Status: None   Collection Time: 03/02/19 11:52 PM   Specimen: Nasopharyngeal Swab  Result Value Ref Range Status   SARS Coronavirus 2 NEGATIVE NEGATIVE Final    Comment: (NOTE) If result is NEGATIVE SARS-CoV-2 target nucleic acids are NOT DETECTED. The SARS-CoV-2 RNA is generally detectable in upper and lower  respiratory specimens during the acute phase of infection. The lowest  concentration of SARS-CoV-2 viral copies this assay can detect is 250  copies / mL. A negative result does not preclude SARS-CoV-2 infection  and should not be used as the sole basis for treatment or other  patient management decisions.  A negative result may occur with  improper specimen collection / handling, submission of specimen other  than nasopharyngeal swab, presence of viral mutation(s) within the  areas targeted by this assay, and inadequate number of viral copies  (<250 copies / mL). A negative result must be combined with clinical  observations, patient history, and epidemiological information. If result is POSITIVE SARS-CoV-2 target nucleic acids are DETECTED. The SARS-CoV-2 RNA is generally detectable in upper and lower  respiratory  specimens dur ing the acute phase of infection.  Positive  results are indicative of active infection with SARS-CoV-2.  Clinical  correlation with patient history and other diagnostic information is  necessary to determine patient infection status.  Positive results do  not rule out bacterial infection or co-infection with other viruses. If result is PRESUMPTIVE POSTIVE SARS-CoV-2 nucleic acids MAY BE PRESENT.   A presumptive positive result was obtained on the submitted specimen  and confirmed on repeat testing.  While 2019 novel coronavirus  (SARS-CoV-2) nucleic acids may be present in the submitted sample  additional confirmatory testing may be necessary for epidemiological  and / or clinical management purposes  to differentiate between  SARS-CoV-2 and other Sarbecovirus currently known to infect humans.  If clinically indicated additional testing with an alternate test  methodology [phone]) is advised. The SARS-CoV-2 RNA is generally  detectable in upper and lower respiratory sp ecimens during the acute  phase of infection. The expected result is Negative. Fact Sheet  for Patients:  BoilerBrush.com.cyhttps://www.fda.gov/media/136312/download Fact Sheet for Healthcare Providers: https://pope.com/https://www.fda.gov/media/136313/download This test is not yet approved or cleared by the [place] FDA and has been authorized for detection and/or diagnosis of SARS-CoV-2 by FDA under an Emergency Use Authorization (EUA).  This EUA will remain in effect (meaning this test can be used) for the duration of the COVID-19 declaration under Section 564(b)(1) of the Act, 21 U.S.C. section 360bbb-3(b)(1), unless the authorization is terminated or revoked sooner. Performed at [facility], [address] [place], [place] [address]     RADIOLOGY:  Dg Chest Port 1 View  Result Date: 03/02/2019 CLINICAL DATA:  Mid sternal pain EXAM: PORTABLE CHEST 1 VIEW COMPARISON:  12/27/2010 FINDINGS: Heart and mediastinal  contours are within normal limits. No focal opacities or effusions. No acute bony abnormality. IMPRESSION: No active disease. Electronically Signed   By: Charlett NoseKevin  Dover M.D.   On: 03/02/2019 23:08    Follow up with PCP in 1 week.  Management plans discussed with the patient, family and they are in agreement.  CODE STATUS: Full code    Code Status Orders  (From admission, onward)         Start     Ordered   03/03/19 0153  Full code  Continuous     03/03/19 0154        Code Status History    This patient has a current code status but no historical code status.   Advance Care Planning Activity      TOTAL TIME TAKING CARE OF THIS PATIENT ON DAY OF DISCHARGE: more than 35 minutes.   Ihor AustinPavan Cindy Fullman M.D on 03/03/2019 at 10:50 AM  Between 7am to 6pm - Pager - [phone]  After 6pm go to [email or web address] - password [email or web address] [facility]  [facility] [place] [facility]  Office  [phone]  CC: Primary care physician; Harvest ForestBakare, Mobolaji B, MD  Note: This dictation was prepared with Dragon dictation along with smaller phrase technology. Any transcriptional errors that result from this process are unintentional.

## 2019-03-03 NOTE — ED Notes (Signed)
.. ED TO INPATIENT HANDOFF REPORT  ED Nurse Name and Phone #: Pattricia Bossnnie [phone]  [phone]  S Name/Age/Gender Justin Davies 48 y.o. male Room/Bed: ED26A/ED26A  Code Status   Code Status: Not on file  Home/SNF/Other Home Patient oriented to: self, place, time and situation Is this baseline? Yes   Triage Complete: Triage complete  Chief Complaint chest pain  Triage Note Pt presents to ED with mid sternal squeezing chest pain since Sunday night. Pt thought it was reflux at first. Pt states yesterday he became nauseated and has been diaphoretic with worsening pain. Pt states nothing seems to make his symptoms better or worse. No hx of the same. +sob. Pt currently has no obvious increased work of breathing or acute distress noted.    Allergies No Known Allergies  Level of Care/Admitting Diagnosis ED Disposition    ED Disposition Condition Comment   Admit  The patient appears reasonably stabilized for admission considering the current resources, flow, and capabilities available in the ED at this time, and I doubt any other [facility] requiring further screening and/or treatment in the ED prior to admission is  present.       B Medical/Surgery History Past Medical History:  Diagnosis Date  . Hypertension   . Multiple gastric ulcers    History reviewed. No pertinent surgical history.   A IV Location/Drains/Wounds Patient Lines/Drains/Airways Status   Active Line/Drains/Airways    Name:   Placement date:   Placement time:   Site:   Days:   Peripheral IV 03/02/19 Right Antecubital   03/02/19    2326    Antecubital   1          Intake/Output Last 24 hours No intake or output data in the 24 hours ending 03/03/19 0045  Labs/Imaging Results for orders placed or performed during the hospital encounter of 03/02/19 (from the past 48 hour(s))  Basic metabolic panel     Status: Abnormal   Collection Time: 03/02/19  8:39 PM  Result Value Ref Range   Sodium 135 135 - 145 mmol/L   Potassium 3.1  (L) 3.5 - 5.1 mmol/L   Chloride 98 98 - 111 mmol/L   CO2 27 22 - 32 mmol/L   Glucose, Bld 110 (H) 70 - 99 mg/dL   BUN 11 6 - 20 mg/dL   Creatinine, Ser 4.780.79 0.61 - 1.24 mg/dL   Calcium 8.9 8.9 - 29.510.3 mg/dL   GFR calc non Af Amer >60 >60 mL/min   GFR calc Af Amer >60 >60 mL/min   Anion gap 10 5 - 15    Comment: Performed at [facility], [address] [place], [place] [address]  CBC     Status: Abnormal   Collection Time: 03/02/19  8:39 PM  Result Value Ref Range   WBC 13.1 (H) 4.0 - 10.5 K/uL   RBC 5.62 4.22 - 5.81 MIL/uL   Hemoglobin 16.6 13.0 - 17.0 g/dL   HCT 86.549.4 78.439.0 - 69.652.0 %   MCV 87.9 80.0 - 100.0 fL   MCH 29.5 26.0 - 34.0 pg   MCHC 33.6 30.0 - 36.0 g/dL   RDW 29.512.6 28.411.5 - 13.215.5 %   Platelets 366 150 - 400 K/uL   nRBC 0.0 0.0 - 0.2 %    Comment: Performed at [facility], [address] [place], [place] [address]  Troponin I (High Sensitivity)     Status: None   Collection Time: 03/02/19  8:39 PM  Result Value Ref Range  Troponin I (High Sensitivity) 7 <18 ng/L    Comment: (NOTE) Elevated high sensitivity troponin I (hsTnI) values and significant  changes across serial measurements may suggest ACS but many other  chronic and acute conditions are known to elevate hsTnI results.  Refer to the "Links" section for chest pain algorithms and additional  guidance. Performed at [facility], [place], [place] [address]   Troponin I (High Sensitivity)     Status: None   Collection Time: 03/02/19 11:00 PM  Result Value Ref Range   Troponin I (High Sensitivity) 4 <18 ng/L    Comment: (NOTE) Elevated high sensitivity troponin I (hsTnI) values and significant  changes across serial measurements may suggest ACS but many other  chronic and acute conditions are known to elevate hsTnI results.  Refer to the "Links" section for chest pain algorithms and additional  guidance. Performed at [facility], [address]  [place], [place] [address]    Dg Chest Port 1 View  Result Date: 03/02/2019 CLINICAL DATA:  Mid sternal pain EXAM: PORTABLE CHEST 1 VIEW COMPARISON:  12/27/2010 FINDINGS: Heart and mediastinal contours are within normal limits. No focal opacities or effusions. No acute bony abnormality. IMPRESSION: No active disease. Electronically Signed   By: Rolm Baptise M.D.   On: 03/02/2019 23:08    Pending Labs Unresulted Labs (From admission, onward)    Start     Ordered   03/02/19 2347  SARS Coronavirus 2 (CEPHEID - Performed in [place] hospital lab), Hosp Order  (Asymptomatic Patients Labs)  Once,   STAT    Question:  Rule Out  Answer:  Yes   03/02/19 2347          Vitals/Pain Today's Vitals   03/02/19 1921 03/02/19 1927 03/02/19 1928 03/02/19 2302  BP: (!) 138/108   (!) 145/99  Pulse: 88   65  Resp: 18   12  Temp: 98.8 F (37.1 C)     TempSrc: Oral     SpO2: 97%   98%  Weight:   74.8 kg   Height:   5\' 6"  (1.676 m)   PainSc:  7       Isolation Precautions No active isolations  Medications Medications  aspirin chewable tablet 324 mg (324 mg Oral Given 03/02/19 2353)  nitroGLYCERIN (NITROGLYN) 2 % ointment 1 inch (1 inch Topical Given 03/02/19 2353)  potassium chloride SA (K-DUR) CR tablet 40 mEq (40 mEq Oral Given 03/02/19 2354)    Mobility walks Low fall risk   Focused Assessments Cardiac Assessment Handoff:  Cardiac Rhythm: Normal sinus rhythm(at 75) Lab Results  Component Value Date   TROPONINI <0.03 08/01/2015   No results found for: DDIMER Does the Patient currently have chest pain? No     R Recommendations: See Admitting Provider Note  Report given to:   Additional Notes:

## 2019-03-03 NOTE — Progress Notes (Signed)
Advanced care plan. Purpose of the Encounter: CODE STATUS Parties in Attendance: Patient Patient's Decision Capacity: Good Subjective/Patient's story: DonaldFuquayis a48 y.o.Caucasian malewith a known history of hypertension and peptic ulcer disease, who presented to the emergency room with acute onset of midsternal chest pain felt like tightness and graded 7/10 in severity with occasional radiation to the right shoulder as well as diaphoresis mild dyspnea and generalized weakness with clammy feeling and dizziness. He denied any cough or wheezing or fever or chills or any recent sick exposures. No leg pain or edema or recent travels or surgeries. No bleeding diathesis. No headache or blurred vision. Upon presentation to the emergency room, blood pressure was elevated 138/108 with otherwise normal vital signs. Labs revealed high-sensitivity troponin of 7 and later on 4. CBC showed leukocytosis 13.1. He was hypokalemic with potassium of 3.1. SARS-COV-2 rapid test came back negative. EKG showed normal sinus rhythm with rate of 83 with PACs and slightly poor R wave progression. Portable chest x-ray revealed no acute cardia pulmonary disease. The patient was given 40 aspirin, 4 mg of IV morphine sulfate and 1 inch of Nitropaste. I ordered 40 mill: P.o. potassium chloride. He will be admitted to an observation telemetry bed for further evaluation and management. Objective/Medical story Patient admitted to telemetry and seen by cardiology.  Potassium needs to be supplemented.  Needs evaluation with serial troponins. Goals of care determination:  Advance care directives goals of care and treatment plan discussed Patient wants everything done which includes CPR, intubation ventilator if the need arises. CODE STATUS: Full code Time spent discussing advanced care planning: 16 minutes

## 2019-03-03 NOTE — Plan of Care (Signed)
  Problem: Education: Goal: Knowledge of General Education information will improve Description: Including pain rating scale, medication(s)/side effects and non-pharmacologic comfort measures Outcome: Progressing   Problem: Education: Goal: Understanding of cardiac disease, CV risk reduction, and recovery process will improve Outcome: Progressing

## 2019-03-03 NOTE — H&P (Addendum)
[facility] [place] [facility]   PATIENT NAME: Justin LyeDonald Visscher    MR#:  [id number]  DATE OF BIRTH:  [birth year]  DATE OF ADMISSION:  03/03/2019  PRIMARY CARE PHYSICIAN: Harvest ForestBakare, Mobolaji B, MD   REQUESTING/REFERRING PHYSICIAN: Chiquita LothSung, Jade, MD CHIEF COMPLAINT:   Chief Complaint  Patient presents with  . Chest Pain    HISTORY OF PRESENT ILLNESS:  Justin Davies  is a 48 y.o. Caucasian male with a known history of hypertension and peptic ulcer disease, who presented to the emergency room with acute onset of midsternal chest pain felt like tightness and graded 7/10 in severity with occasional radiation to the right shoulder as well as diaphoresis mild dyspnea and generalized weakness with clammy feeling and dizziness.  He denied any cough or wheezing or fever or chills or any recent sick exposures.  No leg pain or edema or recent travels or surgeries.  No bleeding diathesis.  No headache or blurred vision.  Upon presentation to the emergency room, blood pressure was elevated 138/108 with otherwise normal vital signs.  Labs revealed high-sensitivity troponin of 7 and later on 4.  CBC showed leukocytosis 13.1.  He was hypokalemic with potassium of 3.1.  SARS- COV-2 rapid test came back negative.  EKG showed normal sinus rhythm with rate of 83 with PACs and slightly poor R wave progression.  Portable chest x-ray revealed no acute cardia pulmonary disease.  The patient was given 40 aspirin, 4 mg of IV morphine sulfate and 1 inch of Nitropaste.  I ordered 40 mill: P.o. potassium chloride.  He will be admitted to an observation telemetry bed for further evaluation and management. PAST MEDICAL HISTORY:   Past Medical History:  Diagnosis Date  . Hypertension   . Multiple gastric ulcers   Migraine headache  PAST SURGICAL HISTORY:  He denied any previous surgeries.  SOCIAL HISTORY:   Social History   Tobacco Use  . Smoking status: Former Smoker    Types: Cigarettes  .  Smokeless tobacco: Never Used  Substance Use Topics  . Alcohol use: No    FAMILY HISTORY:  Positive for leukemia in his maternal grandfather.  DRUG ALLERGIES:  No Known Allergies  REVIEW OF SYSTEMS:   ROS As per history of present illness. All pertinent systems were reviewed above. Constitutional,  HEENT, cardiovascular, respiratory, GI, GU, musculoskeletal, neuro, psychiatric, endocrine,  integumentary and hematologic systems were reviewed and are otherwise  negative/unremarkable except for positive findings mentioned above in the HPI.   MEDICATIONS AT HOME:   Prior to Admission medications   Medication Sig Start Date End Date Taking? Authorizing Provider  azithromycin (ZITHROMAX Z-PAK) 250 MG tablet As directed on package 08/01/15   Sharyn CreamerQuale, Mark, MD  esomeprazole (NEXIUM) 20 MG capsule Take 1 capsule (20 mg total) by mouth daily. 08/01/15   Sharyn CreamerQuale, Mark, MD  ondansetron (ZOFRAN ODT) 4 MG disintegrating tablet Take 1 tablet (4 mg total) by mouth every 6 (six) hours as needed for nausea or vomiting. 08/01/15   Sharyn CreamerQuale, Mark, MD  oxyCODONE-acetaminophen (ROXICET) 5-325 MG tablet Take 1 tablet by mouth every 6 (six) hours as needed for severe pain. 08/01/15   Sharyn CreamerQuale, Mark, MD      VITAL SIGNS:  Blood pressure (!) 145/99, pulse 65, temperature 98.8 F (37.1 C), temperature source Oral, resp. rate 12, height 5\' 6"  (1.676 m), weight 74.8 kg, SpO2 98 %.  PHYSICAL EXAMINATION:  Physical Exam  GENERAL:  48 y.o.-year-old Caucasian male patient lying in the  bed with no acute distress.  EYES: Pupils equal, round, reactive to light and accommodation. No scleral icterus. Extraocular muscles intact.  HEENT: Head atraumatic, normocephalic. Oropharynx and nasopharynx clear.  NECK:  Supple, no jugular venous distention. No thyroid enlargement, no tenderness.  LUNGS: Normal breath sounds bilaterally, no wheezing, rales,rhonchi or crepitation. No use of accessory muscles of respiration.   CARDIOVASCULAR: Regular rate and rhythm, S1, S2 normal. No murmurs, rubs, or gallops.  ABDOMEN: Soft, nondistended, nontender. Bowel sounds present. No organomegaly or mass.  EXTREMITIES: No pedal edema, cyanosis, or clubbing.  NEUROLOGIC: Cranial nerves II through XII are intact. Muscle strength 5/5 in all extremities. Sensation intact. Gait not checked.  PSYCHIATRIC: The patient is alert and oriented x 3.  Normal affect and good eye contact. SKIN: No obvious rash, lesion, or ulcer.   LABORATORY PANEL:   CBC Recent Labs  Lab 03/02/19 2039  WBC 13.1*  HGB 16.6  HCT 49.4  PLT 366   ------------------------------------------------------------------------------------------------------------------  Chemistries  Recent Labs  Lab 03/02/19 2039  NA 135  K 3.1*  CL 98  CO2 27  GLUCOSE 110*  BUN 11  CREATININE 0.79  CALCIUM 8.9   ------------------------------------------------------------------------------------------------------------------  Cardiac Enzymes No results for input(s): TROPONINI in the last 168 hours. ------------------------------------------------------------------------------------------------------------------  RADIOLOGY:  Dg Chest Port 1 View  Result Date: 03/02/2019 CLINICAL DATA:  Mid sternal pain EXAM: PORTABLE CHEST 1 VIEW COMPARISON:  12/27/2010 FINDINGS: Heart and mediastinal contours are within normal limits. No focal opacities or effusions. No acute bony abnormality. IMPRESSION: No active disease. Electronically Signed   By: Rolm Baptise M.D.   On: 03/02/2019 23:08      IMPRESSION AND PLAN:   1.Chest pain, rule out acute coronary syndrome.  The patient will be admitted to an observation telemetry bed.  Will follow serial cardiac enzymes and EKGs.  We will obtain a cardiology consult in a.m. for further cardiac risk stratification.  The patient will be placed on aspirin as well as p.r.n. sublingual nitroglycerin and morphine sulfate for pain.  I  notified Dr. Ubaldo Glassing about the patient.  2.  Hypertensive urgency.  Blood pressure has been as high as 149/118.  He will be placed on PRN IV labetalol.  We will continue lisinopril HCT.  3.  Hypokalemia.  We will replace his potassium and check magnesium level.  4.  Migraine headache.  He will be given as needed Tylenol and will consider holding off his Nitropaste if need be.  5.  Peptic ulcer disease.  He will be placed on p.o. Protonix  6.  DVT prophylaxis.  Subcutaneous Lovenox.   All the records are reviewed and case discussed with ED provider. The plan of care was discussed in details with the patient (and family). I answered all questions. The patient agreed to proceed with the above mentioned plan. Further management will depend upon hospital course.   CODE STATUS: Full code  TOTAL TIME TAKING CARE OF THIS PATIENT: 45 minutes.    Christel Mormon M.D on 03/03/2019 at 12:20 AM  Pager - [phone]  After 6pm go to [email or web address] - Proofreader  [facility] [place] [facility]  Office  [phone]  CC: Primary care physician; Audley Hose, MD   Note: This dictation was prepared with Dragon dictation along with smaller phrase technology. Any transcriptional errors that result from this process are unintentional.

## 2019-03-03 NOTE — ED Notes (Signed)
Floor RN Maricar to call this RN back.

## 2019-03-03 NOTE — Progress Notes (Signed)
Pt was admitted on the floor without no sign of distress. Pt alert and oriented x 4. VSS. Callbell and telephone is within pt reach. Pt was oriented in the floor.  Complaints of a 6 out 10 headache. Tylenol 650 mg PRN was given. Will continue to monitor.

## 2019-03-03 NOTE — Consult Note (Signed)
Cardiology Consultation Note    Patient ID: Justin Davies, MRN: [id number], DOB/AGE: 1971/01/09 48 y.o. Admit date: 03/02/2019   Date of Consult: 03/03/2019 Primary Physician: Harvest ForestBakare, Mobolaji B, MD Primary Cardiologist:    Chief Complaint: chest pain Reason for Consultation: chest pain Requesting MD: Dr. Tobi BastosPyreddy  HPI: Justin Davies is a [age] y.o. male with history of patient is a 48 year old male with history of hypertension and peptic ulcer disease who presented to the emergency room with midsternal chest pain which he rated a 7 out of 10.  He states the pain was constant for about 5 days prior to him presenting.  He presented to the ER because he noted the pain radiated somewhat to his right arm.  Pain was worse with deep breathing and deep palpation.  He denies any trauma to that area.  Still has some pain there when pressing on it.  His blood pressure was mildly elevated in the diastolic at 138/108 otherwise unremarkable.  High-sensitivity troponin was normal at 7, 4 and 4.  He was mildly hypokalemic with a potassium of 3.1.  Electrocardiogram showed sinus rhythm with nonspecific ST-T wave changes.  Chest x-ray was unremarkable for acute cardiopulmonary disease.  Risk factors include hypertension.  Telemetry is revealed no dysrhythmia.  Past Medical History:  Diagnosis Date  . Hypertension   . Multiple gastric ulcers       Surgical History: History reviewed. No pertinent surgical history.   Home Meds: Prior to Admission medications   Medication Sig Start Date End Date Taking? Authorizing Provider  lisinopril-hydrochlorothiazide (ZESTORETIC) 20-12.5 MG tablet TAKE 1 TABLET BY MOUTH EVERY DAY IN THE MORNING 02/22/19   [provider]    Inpatient Medications:  . aspirin EC  81 mg Oral Daily  . enoxaparin (LOVENOX) injection  40 mg Subcutaneous Q24H  . lisinopril  20 mg Oral Daily   And  . hydrochlorothiazide  12.5 mg Oral Daily   . sodium chloride 50 mL/hr at 03/03/19  [id number]    Allergies:  Allergies  Allergen Reactions  . Amoxicillin Other (See Comments)    Foamed in throat  . Bee Venom Swelling    Social History   Socioeconomic History  . Marital status: Married    Spouse name: Not on file  . Number of children: Not on file  . Years of education: Not on file  . Highest education level: Not on file  Occupational History  . Not on file  Social Needs  . Financial resource strain: Not on file  . Food insecurity    Worry: Not on file    Inability: Not on file  . Transportation needs    Medical: Not on file    Non-medical: Not on file  Tobacco Use  . Smoking status: Former Smoker    Types: Cigarettes  . Smokeless tobacco: Never Used  Substance and Sexual Activity  . Alcohol use: No  . Drug use: Never  . Sexual activity: Not on file  Lifestyle  . Physical activity    Days per week: Not on file    Minutes per session: Not on file  . Stress: Not on file  Relationships  . Social Musicianconnections    Talks on phone: Not on file    Gets together: Not on file    Attends religious service: Not on file    Active member of club or organization: Not on file    Attends meetings of clubs or organizations: Not on file  Relationship status: Not on file  . Intimate partner violence    Fear of current or ex partner: Not on file    Emotionally abused: Not on file    Physically abused: Not on file    Forced sexual activity: Not on file  Other Topics Concern  . Not on file  Social History Narrative  . Not on file     No family history on file.   Review of Systems: A 12-system review of systems was performed and is negative except as noted in the HPI.  Labs: No results for input(s): CKTOTAL, CKMB, TROPONINI in the last 72 hours. Lab Results  Component Value Date   WBC 13.1 (H) 03/02/2019   HGB 16.6 03/02/2019   HCT 49.4 03/02/2019   MCV 87.9 03/02/2019   PLT 366 03/02/2019    Recent Labs  Lab 03/02/19 2039  NA 135  K 3.1*  CL 98   CO2 27  BUN 11  CREATININE 0.79  CALCIUM 8.9  GLUCOSE 110*   No results found for: CHOL, HDL, LDLCALC, TRIG No results found for: DDIMER  Radiology/Studies:  Dg Chest Port 1 View  Result Date: 03/02/2019 CLINICAL DATA:  Mid sternal pain EXAM: PORTABLE CHEST 1 VIEW COMPARISON:  12/27/2010 FINDINGS: Heart and mediastinal contours are within normal limits. No focal opacities or effusions. No acute bony abnormality. IMPRESSION: No active disease. Electronically Signed   By: Charlett NoseKevin  Dover M.D.   On: 03/02/2019 23:08    Wt Readings from Last 3 Encounters:  03/03/19 77.6 kg  08/01/15 66.7 kg  03/30/12 68 kg    EKG: nsr with no ischemia or injury  Physical Exam:  Blood pressure 111/78, pulse 60, temperature 98 F (36.7 C), temperature source Oral, resp. rate 19, height 5\' 6"  (1.676 m), weight 77.6 kg, SpO2 98 %. Body mass index is 27.6 kg/m. General: Well developed, well nourished, in no acute distress. Head: Normocephalic, atraumatic, sclera non-icteric, no xanthomas, nares are without discharge.  Neck: Negative for carotid bruits. JVD not elevated. Lungs: Clear bilaterally to auscultation without wheezes, rales, or rhonchi. Breathing is unlabored. Heart: RRR with S1 S2. No murmurs, rubs, or gallops appreciated. Abdomen: Soft, non-tender, non-distended with normoactive bowel sounds. No hepatomegaly. No rebound/guarding. No obvious abdominal masses. Msk:  Strength and tone appear normal for age. Extremities: No clubbing or cyanosis. No edema.  Distal pedal pulses are 2+ and equal bilaterally. Neuro: Alert and oriented X 3. No facial asymmetry. No focal deficit. Moves all extremities spontaneously. Psych:  Responds to questions appropriately with a normal affect.     Assessment and Plan  48 year old male with no prior cardiac history with borderline hypertension treated with lisinopril/hydrochlorothiazide as an outpatient presented to emergency room with for 5 days of constant  superior sternal and right-sided chest pain.  Pain was constant.  Was not improved with eating.  Was worsened with deep breathing or deep palpation.  He had mild dizziness with the pain yesterday and presented to the emergency room.  Electrocardiogram was normal showing no ischemia.  High-sensitivity troponin was 7/4/4.  Chest x-ray was unremarkable.  Pain is still present with palpation in the right upper sternal region next to the manubrium.  This is extremely atypical for ischemic heart disease.  Would continue with current regimen including lisinopril/hydrochlorothiazide for blood pressure control.  Would continue with aspirin 81 mg daily.  Will discontinue IV fluids, discontinue topical nitrates.  Would ambulate and discharged home from a cardiac standpoint.  We will be  happy to see as an outpatient.  Ivin Booty MD 03/03/2019, 7:54 AM Pager: [phone]

## 2019-03-04 LAB — HIV ANTIBODY (ROUTINE TESTING W REFLEX): HIV Screen 4th Generation wRfx: NONREACTIVE

## 2019-03-31 ENCOUNTER — Ambulatory Visit
Admission: RE | Admit: 2019-03-31 | Discharge: 2019-03-31 | Disposition: A | Discharge status: Home / Self Care | Payer: 59 | Source: Ambulatory Visit | Attending: Internal Medicine | Admitting: Internal Medicine

## 2019-03-31 ENCOUNTER — Other Ambulatory Visit: Payer: Self-pay | Admitting: Internal Medicine

## 2019-03-31 ENCOUNTER — Other Ambulatory Visit: Payer: Self-pay

## 2019-03-31 DIAGNOSIS — N61 Mastitis without abscess: Secondary | ICD-10-CM

## 2019-09-11 ENCOUNTER — Ambulatory Visit: Payer: 59 | Attending: Internal Medicine

## 2019-09-11 DIAGNOSIS — Z20822 Contact with and (suspected) exposure to covid-19: Secondary | ICD-10-CM

## 2019-09-12 LAB — NOVEL CORONAVIRUS, NAA: SARS-CoV-2, NAA: NOT DETECTED

## 2021-03-13 ENCOUNTER — Other Ambulatory Visit: Payer: Self-pay

## 2021-03-13 ENCOUNTER — Emergency Department
Admission: EM | Admit: 2021-03-13 | Discharge: 2021-03-13 | Disposition: A | Discharge status: Home / Self Care | Payer: 59 | Attending: Emergency Medicine | Admitting: Emergency Medicine

## 2021-03-13 ENCOUNTER — Encounter: Payer: Self-pay | Admitting: Emergency Medicine

## 2021-03-13 DIAGNOSIS — E876 Hypokalemia: Secondary | ICD-10-CM | POA: Diagnosis not present

## 2021-03-13 DIAGNOSIS — I10 Essential (primary) hypertension: Secondary | ICD-10-CM | POA: Insufficient documentation

## 2021-03-13 DIAGNOSIS — R252 Cramp and spasm: Secondary | ICD-10-CM

## 2021-03-13 DIAGNOSIS — Z87891 Personal history of nicotine dependence: Secondary | ICD-10-CM | POA: Insufficient documentation

## 2021-03-13 LAB — CBC WITH DIFFERENTIAL/PLATELET
Abs Immature Granulocytes: 0.03 10*3/uL (ref 0.00–0.07)
Basophils Absolute: 0 10*3/uL (ref 0.0–0.1)
Basophils Relative: 0 %
Eosinophils Absolute: 0.1 10*3/uL (ref 0.0–0.5)
Eosinophils Relative: 1 %
HCT: 51.1 % (ref 39.0–52.0)
Hemoglobin: 17.7 g/dL — ABNORMAL HIGH (ref 13.0–17.0)
Immature Granulocytes: 0 %
Lymphocytes Relative: 20 %
Lymphs Abs: 2.4 10*3/uL (ref 0.7–4.0)
MCH: 30.6 pg (ref 26.0–34.0)
MCHC: 34.6 g/dL (ref 30.0–36.0)
MCV: 88.4 fL (ref 80.0–100.0)
Monocytes Absolute: 0.9 10*3/uL (ref 0.1–1.0)
Monocytes Relative: 8 %
Neutro Abs: 8.3 10*3/uL — ABNORMAL HIGH (ref 1.7–7.7)
Neutrophils Relative %: 71 %
Platelets: 373 10*3/uL (ref 150–400)
RBC: 5.78 MIL/uL (ref 4.22–5.81)
RDW: 12.3 % (ref 11.5–15.5)
WBC: 11.8 10*3/uL — ABNORMAL HIGH (ref 4.0–10.5)
nRBC: 0 % (ref 0.0–0.2)

## 2021-03-13 LAB — COMPREHENSIVE METABOLIC PANEL
ALT: 34 U/L (ref 0–44)
AST: 22 U/L (ref 15–41)
Albumin: 4.5 g/dL (ref 3.5–5.0)
Alkaline Phosphatase: 69 U/L (ref 38–126)
Anion gap: 11 (ref 5–15)
BUN: 10 mg/dL (ref 6–20)
CO2: 29 mmol/L (ref 22–32)
Calcium: 9.6 mg/dL (ref 8.9–10.3)
Chloride: 96 mmol/L — ABNORMAL LOW (ref 98–111)
Creatinine, Ser: 0.74 mg/dL (ref 0.61–1.24)
GFR, Estimated: >60 mL/min (ref >60)
Glucose, Bld: 137 mg/dL — ABNORMAL HIGH (ref 70–99)
Potassium: 3.4 mmol/L — ABNORMAL LOW (ref 3.5–5.1)
Sodium: 136 mmol/L (ref 135–145)
Total Bilirubin: 1.2 mg/dL (ref 0.3–1.2)
Total Protein: 8.3 g/dL — ABNORMAL HIGH (ref 6.5–8.1)

## 2021-03-13 LAB — LACTIC ACID, PLASMA: Lactic Acid, Venous: 1.3 mmol/L (ref 0.5–1.9)

## 2021-03-13 LAB — MAGNESIUM: Magnesium: 2.1 mg/dL (ref 1.7–2.4)

## 2021-03-13 MED ORDER — POTASSIUM CHLORIDE 20 MEQ PO PACK
40.0000 meq | PACK | Freq: Once | ORAL | Status: AC
  Administered 2021-03-13: 40 meq via ORAL
  Filled 2021-03-13: qty 2

## 2021-03-13 NOTE — ED Notes (Signed)
Wife at bedside. Pt advised "I know I am dehydrated. This has happened before and I am having muscle cramps". He went to [facility] clinic for IV fluids and they advised they do not do that there so they sent him here.

## 2021-03-13 NOTE — ED Provider Notes (Signed)
[facility] Emergency Department Provider Note   ____________________________________________   Event Date/Time   First MD Initiated Contact with Patient 03/13/21 0801     (approximate)  I have reviewed the triage vital signs and the nursing notes.   HISTORY  Chief Complaint Spasms    HPI Justin Davies is a 50 y.o. male who presents for muscle cramps and spasms   LOCATION: Bilateral upper and lower extremities DURATION: 3 days TIMING: Intermittent worsening since onset SEVERITY: Severe QUALITY: Muscle cramps/spasms CONTEXT: Patient states this began 3 days ago and has been stable since onset.  Patient is on hydrochlorothiazide MODIFYING FACTORS: Denies any exacerbating or relieving factors ASSOCIATED SYMPTOMS: Denies   Per medical record review patient has had history of similar symptoms in the past when he has been dehydrated or hypokalemic          Past Medical History:  Diagnosis Date   Hypertension    Multiple gastric ulcers     Patient Active Problem List   Diagnosis Date Noted   Chest pain 03/03/2019    History reviewed. No pertinent surgical history.  Prior to Admission medications   Medication Sig Start Date End Date Taking? Authorizing Provider  lisinopril-hydrochlorothiazide (ZESTORETIC) 20-12.5 MG tablet TAKE 1 TABLET BY MOUTH EVERY DAY IN THE MORNING 02/22/19   [provider]    Allergies Amoxicillin and Bee venom  No family history on file.  Social History Social History   Tobacco Use   Smoking status: Former    Types: Cigarettes   Smokeless tobacco: Never  Vaping Use   Vaping Use: Never used  Substance Use Topics   Alcohol use: No   Drug use: Never    Review of Systems Constitutional: No fever/chills Eyes: No visual changes. ENT: No sore throat. Cardiovascular: Denies chest pain. Respiratory: Denies shortness of breath. Gastrointestinal: No abdominal pain.  No nausea, no vomiting.  No  diarrhea. Genitourinary: Negative for dysuria. Musculoskeletal: Endorses acute intermittent upper and lower extremity muscle spasms/cramps Skin: Negative for rash. Neurological: Negative for headaches, weakness/numbness/paresthesias in any extremity Psychiatric: Negative for suicidal ideation/homicidal ideation   ____________________________________________   PHYSICAL EXAM:  VITAL SIGNS: ED Triage Vitals  Enc Vitals Group     BP 03/13/21 0759 (!) 170/102     Pulse Rate 03/13/21 0759 61     Resp 03/13/21 0759 16     Temp 03/13/21 0759 98.1 F (36.7 C)     Temp Source 03/13/21 0759 Oral     SpO2 03/13/21 0759 98 %     Weight 03/13/21 0758 171 lb 1.2 oz (77.6 kg)     Height 03/13/21 0758 5\' 6"  (1.676 m)     Head Circumference --      Peak Flow --      Pain Score 03/13/21 0823 0     Pain Loc --      Pain Edu? --      Excl. in GC? --    Constitutional: Alert and oriented. Well appearing and in no acute distress. Eyes: Conjunctivae are normal. PERRL. Head: Atraumatic. Nose: No congestion/rhinnorhea. Mouth/Throat: Mucous membranes are moist. Neck: No stridor Cardiovascular: Grossly normal heart sounds.  Good peripheral circulation. Respiratory: Normal respiratory effort.  No retractions. Gastrointestinal: Soft and nontender. No distention. Musculoskeletal: No obvious deformities Neurologic:  Normal speech and language. No gross focal neurologic deficits are appreciated. Skin:  Skin is warm and dry. No rash noted. Psychiatric: Mood and affect are normal. Speech and behavior are  normal.  ____________________________________________   LABS (all labs ordered are listed, but only abnormal results are displayed)  Labs Reviewed  COMPREHENSIVE METABOLIC PANEL - Abnormal; Notable for the following components:      Result Value   Potassium 3.4 (*)    Chloride 96 (*)    Glucose, Bld 137 (*)    Total Protein 8.3 (*)    All other components within normal limits  CBC WITH  DIFFERENTIAL/PLATELET - Abnormal; Notable for the following components:   WBC 11.8 (*)    Hemoglobin 17.7 (*)    Neutro Abs 8.3 (*)    All other components within normal limits  LACTIC ACID, PLASMA  MAGNESIUM  LACTIC ACID, PLASMA    PROCEDURES  Procedure(s) performed (including Critical Care):  .1-3 Lead EKG Interpretation  Date/Time: 03/13/2021 9:36 AM Performed by: Merwyn Katos, MD Authorized by: Merwyn Katos, MD     Interpretation: normal     ECG rate:  55   ECG rate assessment: normal     Rhythm: sinus rhythm     Ectopy: none     Conduction: normal     ____________________________________________   INITIAL IMPRESSION / ASSESSMENT AND PLAN / ED COURSE  As part of my medical decision making, I reviewed the following data within the electronic medical record, if available:  Nursing notes reviewed and incorporated, Labs reviewed, EKG interpreted, Old chart reviewed, Radiograph reviewed and Notes from prior ED visits reviewed and incorporated        This patient presents with generalized muscle cramps/spasm likely due to hypokalemia and dehydration. Doubt intrinsic renal dysfunction or obstructive nephropathy. Considered alternate etiologies of the patients symptoms including infectious processes, severe metabolic derangements or electrolyte abnormalities, ischemia/ACS, heart failure, and intracranial/central processes but think these are unlikely given the history and physical exam.  Plan: labs, 40 mEq potassium resuscitation, reassessment  Dispo: Discharge home with PCP follow-up     ____________________________________________   FINAL CLINICAL IMPRESSION(S) / ED DIAGNOSES  Final diagnoses:  Muscle cramps  Hypokalemia     ED Discharge Orders     None        Note:  This document was prepared using Dragon voice recognition software and may include unintentional dictation errors.    Merwyn Katos, MD 03/13/21 [phone]

## 2021-03-13 NOTE — ED Triage Notes (Addendum)
Pt c/o muscle spasms all over for the past 12hr, states he was seen by his PCP yesterday for routine check up and changes his b/p meds, now taking 2 dose of losartan/HCTZ, denies any recent illness. States he has been drinking plenty of water and Pedialyte

## 2021-05-10 IMAGING — MG DIGITAL DIAGNOSTIC BILATERAL MAMMOGRAM WITH TOMO AND CAD
6 of 12 series · 6 of 36 positions shown · non-contrast
Comparison: Previous exam(s).

CLINICAL DATA: Patient describes palpable knots bilaterally for
approximately 1 month, with swelling and tenderness, 6 o'clock axes
bilaterally.

EXAM:
DIGITAL DIAGNOSTIC BILATERAL MAMMOGRAM WITH CAD AND TOMO
ULTRASOUND BILATERAL BREAST

[L SIO synth-2D]
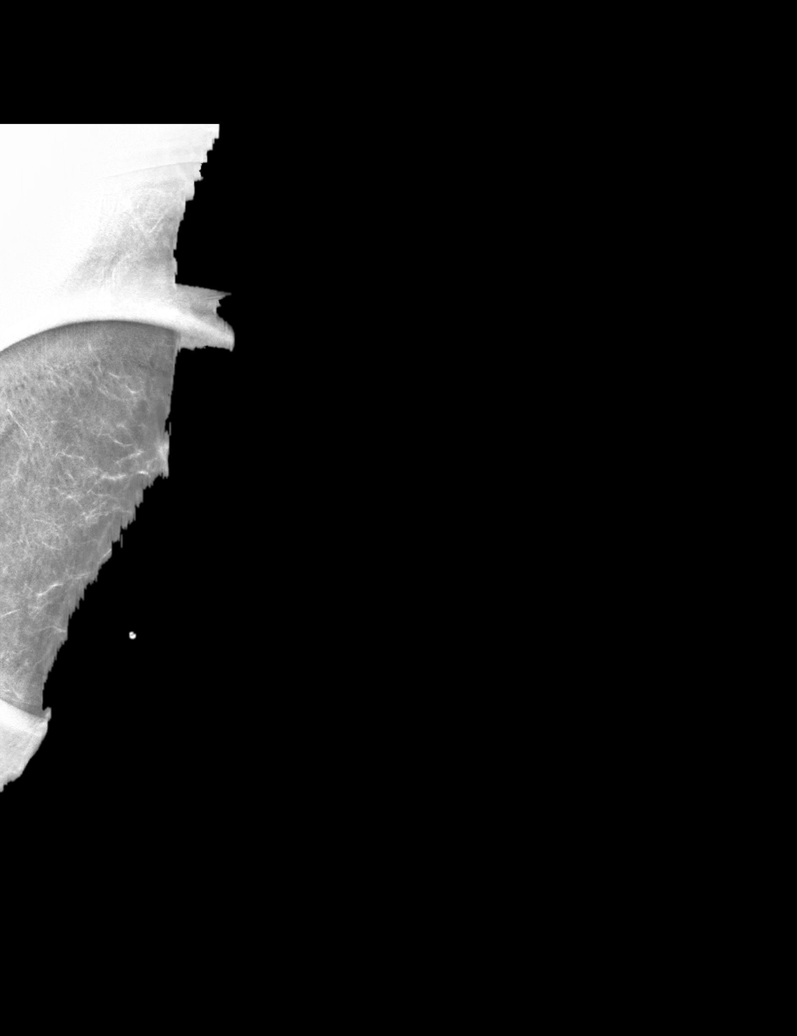

[L MLO synth-2D]
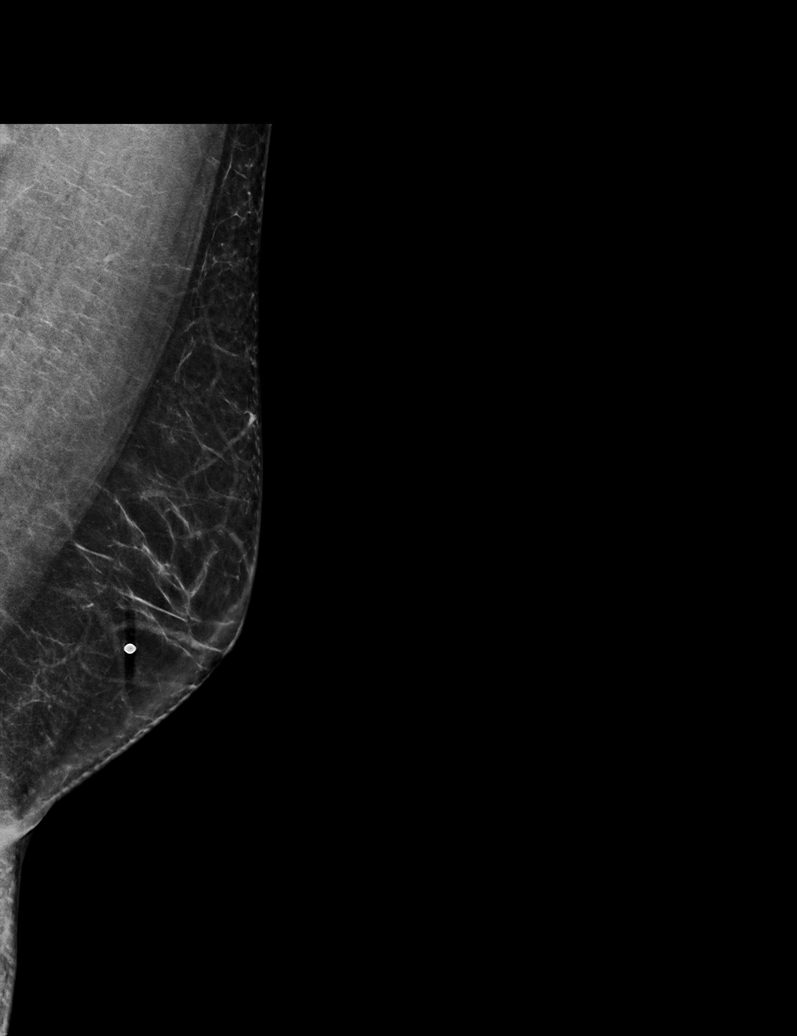

[R TAN synth-2D]
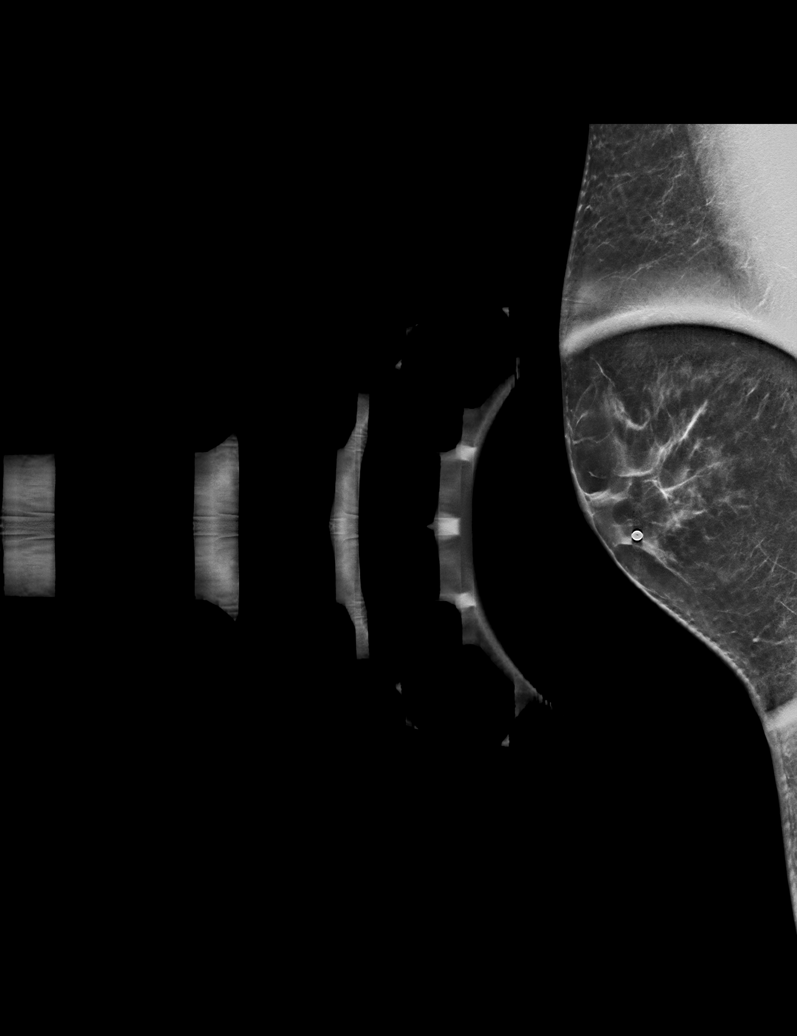

[L CC synth-2D]
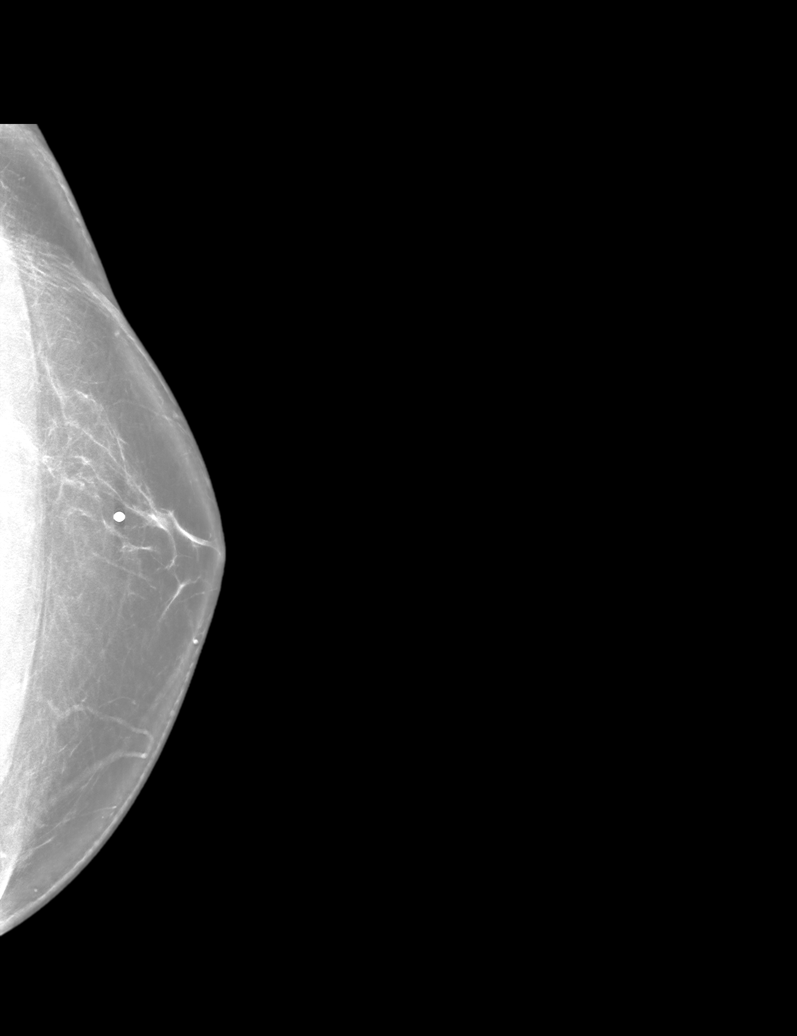

[R CC synth-2D]
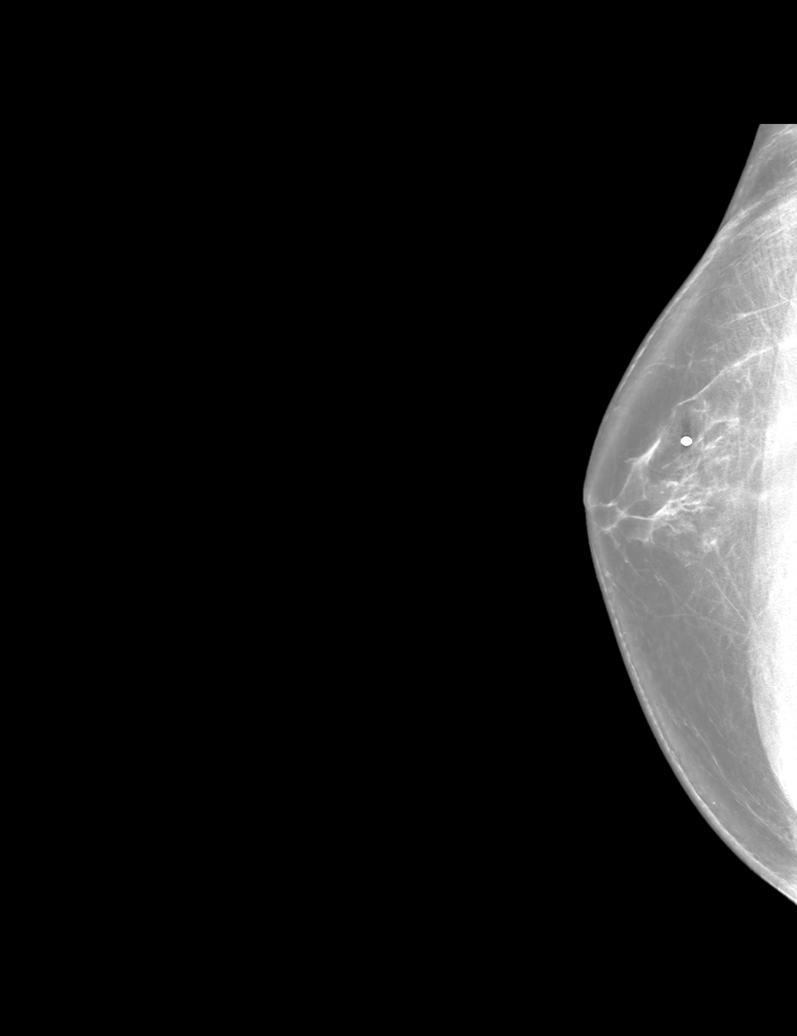

[R MLO synth-2D]
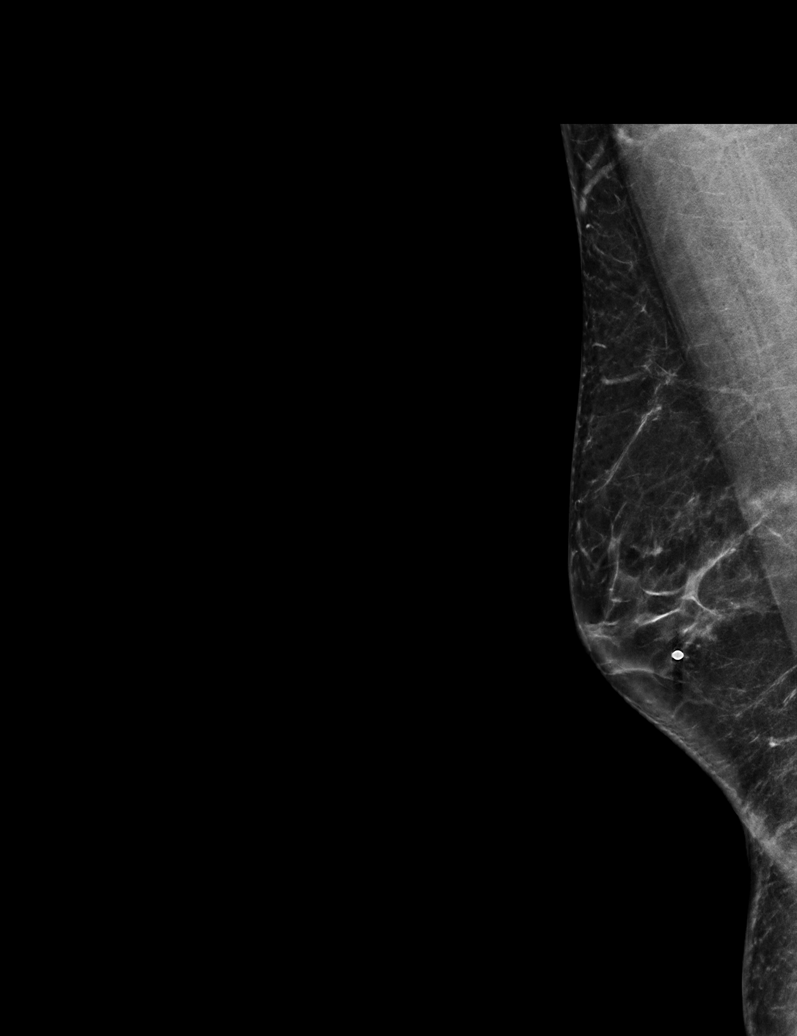

[6 of 36 positions shown; findings below may reference images not displayed]

ACR Breast Density Category b: There are scattered areas of
fibroglandular density.
FINDINGS: There is mild bilateral gynecomastia, RIGHT slightly greater than
LEFT, dendritic type. No dominant masses, suspicious calcifications
or secondary signs of malignancy within either breast

Mammographic images were processed with CAD.

Targeted ultrasound is performed, evaluating the bilateral
subareolar regions, showing confirming mild bilateral gynecomastia.
No solid or cystic mass is identified within either breast.
IMPRESSION: Mild bilateral gynecomastia, RIGHT slightly greater than LEFT,
dendritic type, corresponding to the areas of clinical concern.

RECOMMENDATION:
1. Clinical follow-up for the bilateral gynecomastia.
2. I discussed with the patient the fact that gynecomastia can occur
in older men as testosterone levels decrease with age or in younger
men with low testosterone levels, causing a change in the serum
testosterone:estrogen ratio. We also discussed other potential
etiologies of gynecomastia including numerous prescription
medications and chronic liver disease. Recreational drugs (marijuana
and anabolic steroids in particular) can also cause gynecomastia. We
discussed the possibility of surgical excision if symptoms continue
and if an etiology of the gynecomastia cannot be determined and
therefore corrected.

I have discussed the findings and recommendations with the patient.
Results were also provided in writing at the conclusion of the
visit. If applicable, a reminder letter will be sent to the patient
regarding the next appointment.

BI-RADS CATEGORY  2: Benign.

## 2022-03-26 ENCOUNTER — Emergency Department: Payer: 59

## 2022-03-26 ENCOUNTER — Encounter: Payer: Self-pay | Admitting: Emergency Medicine

## 2022-03-26 ENCOUNTER — Emergency Department
Admission: EM | Admit: 2022-03-26 | Discharge: 2022-03-26 | Disposition: A | Discharge status: Home / Self Care | Payer: 59 | Attending: Emergency Medicine | Admitting: Emergency Medicine

## 2022-03-26 ENCOUNTER — Other Ambulatory Visit: Payer: Self-pay

## 2022-03-26 ENCOUNTER — Ambulatory Visit
Admission: RE | Admit: 2022-03-26 | Discharge: 2022-03-26 | Payer: 59 | Source: Ambulatory Visit | Attending: Physician Assistant | Admitting: Physician Assistant

## 2022-03-26 ENCOUNTER — Ambulatory Visit (INDEPENDENT_AMBULATORY_CARE_PROVIDER_SITE_OTHER): Payer: 59

## 2022-03-26 VITALS — BP 171/115 | HR 77 | Temp 98.2°F | Resp 18

## 2022-03-26 DIAGNOSIS — S92301A Fracture of unspecified metatarsal bone(s), right foot, initial encounter for closed fracture: Secondary | ICD-10-CM

## 2022-03-26 DIAGNOSIS — I10 Essential (primary) hypertension: Secondary | ICD-10-CM | POA: Diagnosis not present

## 2022-03-26 DIAGNOSIS — S92331A Displaced fracture of third metatarsal bone, right foot, initial encounter for closed fracture: Secondary | ICD-10-CM

## 2022-03-26 DIAGNOSIS — S92321A Displaced fracture of second metatarsal bone, right foot, initial encounter for closed fracture: Secondary | ICD-10-CM

## 2022-03-26 DIAGNOSIS — S99921A Unspecified injury of right foot, initial encounter: Secondary | ICD-10-CM | POA: Diagnosis present

## 2022-03-26 DIAGNOSIS — X58XXXA Exposure to other specified factors, initial encounter: Secondary | ICD-10-CM | POA: Diagnosis not present

## 2022-03-26 LAB — CBC
HCT: 47.9 % (ref 39.0–52.0)
Hemoglobin: 15.9 g/dL (ref 13.0–17.0)
MCH: 29.9 pg (ref 26.0–34.0)
MCHC: 33.2 g/dL (ref 30.0–36.0)
MCV: 90.2 fL (ref 80.0–100.0)
Platelets: 311 10*3/uL (ref 150–400)
RBC: 5.31 MIL/uL (ref 4.22–5.81)
RDW: 12.5 % (ref 11.5–15.5)
WBC: 10.8 10*3/uL — ABNORMAL HIGH (ref 4.0–10.5)
nRBC: 0 % (ref 0.0–0.2)

## 2022-03-26 LAB — BASIC METABOLIC PANEL
Anion gap: 5 (ref 5–15)
BUN: 12 mg/dL (ref 6–20)
CO2: 29 mmol/L (ref 22–32)
Calcium: 9.3 mg/dL (ref 8.9–10.3)
Chloride: 105 mmol/L (ref 98–111)
Creatinine, Ser: 1.07 mg/dL (ref 0.61–1.24)
GFR, Estimated: >60 mL/min (ref >60)
Glucose, Bld: 95 mg/dL (ref 70–99)
Potassium: 3.9 mmol/L (ref 3.5–5.1)
Sodium: 139 mmol/L (ref 135–145)

## 2022-03-26 MED ORDER — OXYCODONE-ACETAMINOPHEN 5-325 MG PO TABS
1.0000 | ORAL_TABLET | Freq: Once | ORAL | Status: AC
  Administered 2022-03-26: 1 via ORAL
  Filled 2022-03-26: qty 1

## 2022-03-26 MED ORDER — GADOBUTROL 1 MMOL/ML IV SOLN
7.0000 mL | Freq: Once | INTRAVENOUS | Status: AC | PRN
  Administered 2022-03-26: 7 mL via INTRAVENOUS

## 2022-03-26 MED ORDER — OXYCODONE-ACETAMINOPHEN 10-325 MG PO TABS: 1.0000 | ORAL_TABLET | Freq: Four times a day (QID) | ORAL | 0 refills | Status: AC | PRN

## 2022-03-26 NOTE — ED Notes (Signed)
Patient is being discharged from the Urgent Care and sent to the Emergency Department via personal vehicle . Per Provider Erma Pinto, patient is in need of higher level of care due to needing MRI for foot injury. Patient is aware and verbalizes understanding of plan of care.  Vitals:   03/26/22 1412  BP: (!) 171/115  Pulse: 77  Resp: 18  Temp: 98.2 F (36.8 C)  SpO2: 97%

## 2022-03-26 NOTE — ED Provider Notes (Signed)
  Physical Exam  BP (!) 171/118 (BP Location: Left Arm)   Pulse (!) 55   Temp 98.4 F (36.9 C) (Oral)   Resp 18   Ht 5\' 6"  (1.676 m)   Wt 70.3 kg   SpO2 98%   BMI 25.02 kg/m   Physical Exam    Awake, no distress.  CV:                  Good peripheral perfusion.  Regular rate and rhythm  Resp:               Normal effort.  Equal breath sounds bilaterally.  Abd:                 No distention.  Soft, nontender.  No rebound or guarding. Other:              Mild swelling of the distal right foot with moderate tenderness to palpation over the mid to distal metatarsals.  Neurovascular intact distally.   Procedures  Procedures  ED Course / MDM    Medical Decision Making Amount and/or Complexity of Data Reviewed Labs: ordered. Radiology: ordered.  Risk Prescription drug management.   This patient was transferred over to me from Dr. at approximately 1930.  Plan is to follow-up on MRI results for osteomyelitis rule out.  Patient is currently stable at this time.  MRI results show subacute transverse fracture of the second and third metatarsal necks with marrow edema and enhancement.  Mildly low T1 marrow signal in the metatarsal heads and heterogeneous subchondral bone of the MTP joints, potentially representing early necrosis.  Osteomyelitis could have similar appearance, but findings are favored to be posttraumatic.  Spoke with on-call podiatrist, Dr. Lenard Lance, who advised that these findings are likely not due to osteomyelitis.  Recommended cam boot, crutches, and follow-up with him within a week.  I believe this is reasonable.  Instructed patient to not be weightbearing during this time.  We will provide him with a short-term prescription for oxycodone/acetaminophen.  Will discharge.       Ether Griffins, Varney Daily 03/26/22 2140    03/28/22, MD 03/30/22 [phone]

## 2022-03-26 NOTE — ED Triage Notes (Signed)
Pt to ED POV for right dorsal foot pain. Pt. States he injured 5 weeks pta, and foot still hurts and is swollen. Pt. Went to UC, states foot has multiple fractures,  who sent him to ED "for MRI to rule out bone infection." Pt. Denies n/v/d, or fever.

## 2022-03-26 NOTE — ED Provider Notes (Signed)
Patient here today for evaluation of foot injury that occurred 5 weeks ago. He reports that he thinks he might have worsened injury yesterday. Xray reveals possible osteomyelitis- MRI recommended- advised patient to report to ED for further evaluation.    Tomi Bamberger, PA-C 03/26/22 1451

## 2022-03-26 NOTE — ED Provider Notes (Signed)
[facility] Provider Note    Event Date/Time   First MD Initiated Contact with Patient 03/26/22 1604     (approximate)  History   Chief Complaint: Foot Pain (Pt to ED POV for right dorsal foot pain. Pt. States he injured 5 weeks pta, and foot still hurts and is swollen. Pt. Went to UC, states foot has multiple fractures,  who sent him to ED "for MRI to rule out bone infection." Pt. Denies n/v/d, or fever.)  HPI  Justin BARTOSZEK is a 51 y.o. male with a past medical history of hypertension presents emergency department for right foot pain.  According to the patient proximally 5 weeks ago he was fishing at the beach when he stepped off of the sand quickly to avoid a jellyfish and felt immediate pain and had swelling and discoloration to the right foot.  Patient states he has been avoiding getting it checked out as he assumed it would heal on its own however yesterday he states he was not down he went to stand up and he felt another pop in the foot and had return of pain that have been slowly improving so he went to the urgent care today.  At the urgent care they did an x-ray showing a second and third distal metatarsal fracture and concern for possible osteolysis versus osteomyelitis and recommended the patient get an MRI.  Urgent care sent the patient to the emergency department to rule out osteomyelitis.  Patient denies any fever at any point.  Physical Exam   Triage Vital Signs: ED Triage Vitals  Enc Vitals Group     BP 03/26/22 1550 (!) 171/118     Pulse Rate 03/26/22 1550 (!) 55     Resp 03/26/22 1550 18     Temp 03/26/22 1550 98.4 F (36.9 C)     Temp Source 03/26/22 1550 Oral     SpO2 03/26/22 1550 98 %     Weight 03/26/22 1551 155 lb (70.3 kg)     Height 03/26/22 1551 5\' 6"  (1.676 m)     Head Circumference --      Peak Flow --      Pain Score 03/26/22 1551 6     Pain Loc --      Pain Edu? --      Excl. in GC? --     Most recent vital  signs: Vitals:   03/26/22 1550  BP: (!) 171/118  Pulse: (!) 55  Resp: 18  Temp: 98.4 F (36.9 C)  SpO2: 98%    General: Awake, no distress.  CV:  Good peripheral perfusion.  Regular rate and rhythm  Resp:  Normal effort.  Equal breath sounds bilaterally.  Abd:  No distention.  Soft, nontender.  No rebound or guarding. Other:  Mild swelling of the distal right foot with moderate tenderness to palpation over the mid to distal metatarsals.  Neurovascular intact distally.   ED Results / Procedures / Treatments   RADIOLOGY  I have personally reviewed the x-ray images taken earlier today at the urgent care.  On my interpretation patient has fractures of the distal second and third metatarsals with some haziness around the distal third metatarsal.   MEDICATIONS ORDERED IN ED: Medications - No data to display   IMPRESSION / MDM / ASSESSMENT AND PLAN / ED COURSE  I reviewed the triage vital signs and the nursing notes.  Patient's presentation is most consistent with acute presentation with potential threat to life  or bodily function.  Patient presents emergency department for right foot pain.  Patient was seen at urgent care earlier today had an x-ray showing second third metatarsal fractures with concern for possible osteomyelitis and the patient was referred to the emergency department for evaluation.  Here the patient appears well, no erythema of the foot there is some mild swelling of the foot and moderate tenderness to palpation of the distal right foot.  Given the x-ray findings and concern for osteomyelitis on x-ray images I offered to proceed with MRI imaging patient is agreeable.  We will check lab work and obtain an MRI with and without contrast to evaluate for osteomyelitis.  Patient agreeable to plan of care.  MRI planned for 730 to 9 PM.  Patient's CBC and BMP are reassuring.  FINAL CLINICAL IMPRESSION(S) / ED DIAGNOSES   Foot fracture    Note:  This document was  prepared using Dragon voice recognition software and may include unintentional dictation errors.   Minna Antis, MD 03/26/22 Nicholos Johns

## 2022-03-26 NOTE — Discharge Instructions (Addendum)
-  You may take Tylenol/ibuprofen as needed for the pain.  Utilize oxycodone sparingly.  Do not bear weight on your foot until you have been seen by podiatry.   -Please follow-up with the podiatrist listed in these instructions as discussed.  -Return to the emergency department anytime if you begin to experience any new or worsening symptoms.

## 2022-03-26 NOTE — ED Notes (Signed)
E signature pad not working. Pt educated on discharge instructions and verbalized understanding.  

## 2022-03-26 NOTE — ED Triage Notes (Signed)
Pt presents with right foot injury after [place] while [place] the beach X 4 weeks ago; pt states the pain has not improved.

## 2024-06-14 ENCOUNTER — Other Ambulatory Visit: Payer: Self-pay | Admitting: Internal Medicine

## 2024-06-14 DIAGNOSIS — R1031 Right lower quadrant pain: Secondary | ICD-10-CM

## 2024-06-26 ENCOUNTER — Ambulatory Visit
Admission: RE | Admit: 2024-06-26 | Discharge: 2024-06-26 | Disposition: A | Discharge status: Home / Self Care | Source: Ambulatory Visit | Attending: Internal Medicine | Admitting: Internal Medicine

## 2024-06-26 DIAGNOSIS — R1031 Right lower quadrant pain: Secondary | ICD-10-CM

## 2024-06-26 MED ORDER — IOPAMIDOL (ISOVUE-300) INJECTION 61%
100.0000 mL | Freq: Once | INTRAVENOUS | Status: AC | PRN
  Administered 2024-06-26: 100 mL via INTRAVENOUS

## 2024-07-03 ENCOUNTER — Other Ambulatory Visit: Payer: Self-pay | Admitting: Internal Medicine

## 2024-07-03 DIAGNOSIS — E785 Hyperlipidemia, unspecified: Secondary | ICD-10-CM
